# Patient Record
Sex: Male | Born: 1965 | Race: White | Hispanic: No | Marital: Married | Smoking: Current every day smoker
Health system: Southern US, Community
[De-identification: ages and names within clinical notes are randomized; demographics above are authoritative.]

## PROBLEM LIST (undated history)

## (undated) DIAGNOSIS — Z87442 Personal history of urinary calculi: Secondary | ICD-10-CM

## (undated) DIAGNOSIS — F1910 Other psychoactive substance abuse, uncomplicated: Secondary | ICD-10-CM

## (undated) DIAGNOSIS — J449 Chronic obstructive pulmonary disease, unspecified: Secondary | ICD-10-CM

## (undated) DIAGNOSIS — Z720 Tobacco use: Secondary | ICD-10-CM

## (undated) HISTORY — PX: EYE SURGERY: SHX253

## (undated) HISTORY — PX: WISDOM TOOTH EXTRACTION: SHX21

---

## 2001-04-23 ENCOUNTER — Emergency Department (HOSPITAL_COMMUNITY): Admission: EM | Admit: 2001-04-23 | Discharge: 2001-04-24 | Payer: Self-pay

## 2007-08-20 ENCOUNTER — Emergency Department (HOSPITAL_COMMUNITY): Admission: EM | Admit: 2007-08-20 | Discharge: 2007-08-20 | Payer: Self-pay | Admitting: Emergency Medicine

## 2008-06-20 ENCOUNTER — Emergency Department (HOSPITAL_COMMUNITY): Admission: EM | Admit: 2008-06-20 | Discharge: 2008-06-21 | Payer: Self-pay | Admitting: Emergency Medicine

## 2009-01-28 ENCOUNTER — Emergency Department (HOSPITAL_COMMUNITY): Admission: EM | Admit: 2009-01-28 | Discharge: 2009-01-28 | Payer: Self-pay | Admitting: Emergency Medicine

## 2009-09-08 ENCOUNTER — Emergency Department (HOSPITAL_COMMUNITY): Admission: EM | Admit: 2009-09-08 | Discharge: 2009-09-08 | Payer: Self-pay | Admitting: Emergency Medicine

## 2010-07-05 ENCOUNTER — Emergency Department (HOSPITAL_COMMUNITY): Admission: EM | Admit: 2010-07-05 | Discharge: 2010-07-05 | Payer: Self-pay | Admitting: Emergency Medicine

## 2011-01-24 LAB — DIFFERENTIAL
Basophils Absolute: 0 K/uL (ref 0.0–0.1)
Basophils Relative: 0 % (ref 0–1)
Eosinophils Absolute: 0 10*3/uL (ref 0.0–0.7)
Eosinophils Relative: 0 % (ref 0–5)
Lymphocytes Relative: 13 % (ref 12–46)
Lymphs Abs: 2.2 10*3/uL (ref 0.7–4.0)
Monocytes Absolute: 0.7 10*3/uL (ref 0.1–1.0)
Monocytes Relative: 4 % (ref 3–12)
Neutro Abs: 14.7 10*3/uL — ABNORMAL HIGH (ref 1.7–7.7)
Neutrophils Relative %: 83 % — ABNORMAL HIGH (ref 43–77)

## 2011-01-24 LAB — COMPREHENSIVE METABOLIC PANEL WITH GFR
ALT: 14 U/L (ref 0–53)
AST: 24 U/L (ref 0–37)
Albumin: 4.3 g/dL (ref 3.5–5.2)
Alkaline Phosphatase: 79 U/L (ref 39–117)
CO2: 27 meq/L (ref 19–32)
Calcium: 9.2 mg/dL (ref 8.4–10.5)
Creatinine, Ser: 1.05 mg/dL (ref 0.4–1.5)
GFR calc Af Amer: >60 mL/min (ref >60)
Glucose, Bld: 99 mg/dL (ref 70–99)
Potassium: 3.5 meq/L (ref 3.5–5.1)

## 2011-01-24 LAB — URINALYSIS, ROUTINE W REFLEX MICROSCOPIC
Bilirubin Urine: NEGATIVE
Glucose, UA: NEGATIVE mg/dL
Hgb urine dipstick: NEGATIVE
Ketones, ur: NEGATIVE mg/dL
Nitrite: NEGATIVE
Protein, ur: NEGATIVE mg/dL
Specific Gravity, Urine: 1.01 (ref 1.005–1.030)
Urobilinogen, UA: 0.2 mg/dL (ref 0.0–1.0)
pH: 7 (ref 5.0–8.0)

## 2011-01-24 LAB — COMPREHENSIVE METABOLIC PANEL
BUN: 8 mg/dL (ref 6–23)
Chloride: 103 mEq/L (ref 96–112)
GFR calc non Af Amer: >60 mL/min (ref >60)
Sodium: 139 mEq/L (ref 135–145)
Total Bilirubin: 0.7 mg/dL (ref 0.3–1.2)
Total Protein: 7.4 g/dL (ref 6.0–8.3)

## 2011-01-24 LAB — CBC
HCT: 44.8 % (ref 39.0–52.0)
Hemoglobin: 15.7 g/dL (ref 13.0–17.0)
MCHC: 34.9 g/dL (ref 30.0–36.0)
MCV: 89.1 fL (ref 78.0–100.0)
Platelets: 243 K/uL (ref 150–400)
RBC: 5.03 MIL/uL (ref 4.22–5.81)
RDW: 13.2 % (ref 11.5–15.5)
WBC: 17.7 10*3/uL — ABNORMAL HIGH (ref 4.0–10.5)

## 2011-01-24 LAB — LIPASE, BLOOD: Lipase: 12 U/L (ref 11–59)

## 2011-01-31 LAB — POCT I-STAT, CHEM 8
BUN: 12 mg/dL (ref 6–23)
Calcium, Ion: 1.18 mmol/L (ref 1.12–1.32)
Glucose, Bld: 112 mg/dL — ABNORMAL HIGH (ref 70–99)
HCT: 46 % (ref 39.0–52.0)
Sodium: 139 mEq/L (ref 135–145)

## 2011-01-31 LAB — POCT CARDIAC MARKERS: CKMB, poc: <1 ng/mL — ABNORMAL LOW (ref 1.0–8.0)

## 2011-08-01 LAB — I-STAT 8, (EC8 V) (CONVERTED LAB)
Bicarbonate: 22.1
Glucose, Bld: 130 — ABNORMAL HIGH
HCT: 45
Hemoglobin: 15.3
TCO2: 23
pCO2, Ven: 37.6 — ABNORMAL LOW

## 2011-08-01 LAB — URINE MICROSCOPIC-ADD ON

## 2011-08-01 LAB — CBC
HCT: 43.3
RBC: 4.93
RDW: 13

## 2011-08-01 LAB — DIFFERENTIAL
Eosinophils Absolute: 0.2
Eosinophils Relative: 1
Lymphocytes Relative: 20
Lymphs Abs: 3.1
Monocytes Relative: 4

## 2011-08-01 LAB — POCT I-STAT CREATININE: Operator id: 279831

## 2011-08-01 LAB — URINALYSIS, ROUTINE W REFLEX MICROSCOPIC

## 2014-12-30 ENCOUNTER — Emergency Department (HOSPITAL_COMMUNITY)
Admission: EM | Admit: 2014-12-30 | Discharge: 2014-12-30 | Disposition: A | Discharge status: Home / Self Care | Payer: Self-pay | Attending: Emergency Medicine | Admitting: Emergency Medicine

## 2014-12-30 ENCOUNTER — Encounter (HOSPITAL_COMMUNITY): Payer: Self-pay

## 2014-12-30 ENCOUNTER — Emergency Department (HOSPITAL_COMMUNITY): Payer: Self-pay

## 2014-12-30 DIAGNOSIS — F1410 Cocaine abuse, uncomplicated: Secondary | ICD-10-CM | POA: Insufficient documentation

## 2014-12-30 DIAGNOSIS — Z720 Tobacco use: Secondary | ICD-10-CM | POA: Insufficient documentation

## 2014-12-30 DIAGNOSIS — F1210 Cannabis abuse, uncomplicated: Secondary | ICD-10-CM | POA: Insufficient documentation

## 2014-12-30 DIAGNOSIS — Z880 Allergy status to penicillin: Secondary | ICD-10-CM | POA: Insufficient documentation

## 2014-12-30 DIAGNOSIS — R569 Unspecified convulsions: Secondary | ICD-10-CM | POA: Insufficient documentation

## 2014-12-30 LAB — URINE MICROSCOPIC-ADD ON

## 2014-12-30 LAB — URINALYSIS, ROUTINE W REFLEX MICROSCOPIC
Bilirubin Urine: NEGATIVE
GLUCOSE, UA: NEGATIVE mg/dL
Ketones, ur: 15 mg/dL — AB
LEUKOCYTES UA: NEGATIVE
Nitrite: NEGATIVE
Protein, ur: 30 mg/dL — AB
Specific Gravity, Urine: 1.017 (ref 1.005–1.030)
Urobilinogen, UA: 0.2 mg/dL (ref 0.0–1.0)
pH: 5 (ref 5.0–8.0)

## 2014-12-30 LAB — CBC WITH DIFFERENTIAL/PLATELET
BASOS PCT: 0 % (ref 0–1)
Basophils Absolute: 0 10*3/uL (ref 0.0–0.1)
EOS ABS: 0 10*3/uL (ref 0.0–0.7)
EOS PCT: 0 % (ref 0–5)
HCT: 41.7 % (ref 39.0–52.0)
Hemoglobin: 14.8 g/dL (ref 13.0–17.0)
LYMPHS PCT: 10 % — AB (ref 12–46)
Lymphs Abs: 1.5 10*3/uL (ref 0.7–4.0)
MCH: 29.8 pg (ref 26.0–34.0)
MCHC: 35.5 g/dL (ref 30.0–36.0)
MCV: 84.1 fL (ref 78.0–100.0)
MONOS PCT: 6 % (ref 3–12)
Monocytes Absolute: 0.9 10*3/uL (ref 0.1–1.0)
NEUTROS PCT: 84 % — AB (ref 43–77)
Neutro Abs: 11.9 10*3/uL — ABNORMAL HIGH (ref 1.7–7.7)
Platelets: 219 10*3/uL (ref 150–400)
RBC: 4.96 MIL/uL (ref 4.22–5.81)
RDW: 13 % (ref 11.5–15.5)
WBC: 14.3 10*3/uL — ABNORMAL HIGH (ref 4.0–10.5)

## 2014-12-30 LAB — BASIC METABOLIC PANEL
Anion gap: 8 (ref 5–15)
BUN: 14 mg/dL (ref 6–23)
CALCIUM: 8.8 mg/dL (ref 8.4–10.5)
CO2: 22 mmol/L (ref 19–32)
CREATININE: 1.1 mg/dL (ref 0.50–1.35)
Chloride: 106 mmol/L (ref 96–112)
GFR calc Af Amer: >90 mL/min (ref >90)
GFR calc non Af Amer: 79 mL/min — ABNORMAL LOW (ref >90)
Glucose, Bld: 97 mg/dL (ref 70–99)
Potassium: 3.9 mmol/L (ref 3.5–5.1)
SODIUM: 136 mmol/L (ref 135–145)

## 2014-12-30 LAB — RAPID URINE DRUG SCREEN, HOSP PERFORMED
Amphetamines: NOT DETECTED
BARBITURATES: NOT DETECTED
BENZODIAZEPINES: NOT DETECTED
Cocaine: POSITIVE — AB
Opiates: NOT DETECTED
Tetrahydrocannabinol: POSITIVE — AB

## 2014-12-30 LAB — ETHANOL

## 2014-12-30 MED ORDER — GADOBENATE DIMEGLUMINE 529 MG/ML IV SOLN
20.0000 mL | Freq: Once | INTRAVENOUS | Status: AC | PRN
  Administered 2014-12-30: 18 mL via INTRAVENOUS

## 2014-12-30 NOTE — ED Notes (Signed)
Patient transported to CT 

## 2014-12-30 NOTE — ED Provider Notes (Signed)
CSN: [id number]     Arrival date & time 12/30/14  1116 History   First MD Initiated Contact with Patient 12/30/14 1118     Chief Complaint  Patient presents with  . Seizures     (Consider location/radiation/quality/duration/timing/severity/associated sxs/prior Treatment) HPI Comments: Patient is a 49 year old male with no past medical history who presents via EMS after a seizure that occurred at work. 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 The shaking lasted about 5 minutes before resolving. Patient had a 15-20 minute period of confusion following the shaking. Patient has no complaints at this time. No aggravating/alleviating factors. No associated symptoms. Patient denies any history of seizures.    History reviewed. No pertinent past medical history. Past Surgical History  Procedure Laterality Date  . Wisdom tooth extraction     History reviewed. No pertinent family history. History  Substance Use Topics  . Smoking status: Current Every Day Smoker -- 2.00 packs/day  . Smokeless tobacco: Not on file  . Alcohol Use: 8.4 oz/week    14 Cans of beer per week    Review of Systems  Constitutional: Negative for fever, chills and fatigue.  HENT: Negative for trouble swallowing.   Eyes: Negative for visual disturbance.  Respiratory: Negative for shortness of breath.   Cardiovascular: Negative for chest pain and palpitations.  Gastrointestinal: Negative for nausea, vomiting, abdominal pain and diarrhea.  Genitourinary: Negative for dysuria and difficulty urinating.  Musculoskeletal: Negative for arthralgias and neck pain.  Skin: Negative for color change.  Neurological: Positive for seizures. Negative for dizziness and weakness.  Psychiatric/Behavioral: Negative for dysphoric mood.      Allergies  Penicillins  Home Medications   Prior to Admission medications   Medication Sig Start  Date End Date Taking? Authorizing Provider  Aspirin-Salicylamide-Caffeine (BC HEADACHE POWDER PO) Take 2 each by mouth as needed (pain/headache).   Yes Historical Provider, MD   BP 122/87 mmHg  Pulse 71  Temp(Src) 98.4 F (36.9 C) (Oral)  Resp 10  SpO2 97% Physical Exam  Constitutional: He is oriented to person, place, and time. He appears well-developed and well-nourished. No distress.  HENT:  Head: Normocephalic and atraumatic.  Eyes: Conjunctivae and EOM are normal. Pupils are equal, round, and reactive to light.  Neck: Normal range of motion.  Cardiovascular: Normal rate and regular rhythm.  Exam reveals no gallop and no friction rub.   No murmur heard. Pulmonary/Chest: Effort normal and breath sounds normal. He has no wheezes. He has no rales. He exhibits no tenderness.  Abdominal: Soft. He exhibits no distension. There is no tenderness. There is no rebound.  Musculoskeletal: Normal range of motion.  Neurological: He is alert and oriented to person, place, and time. No cranial nerve deficit. Coordination normal.  Extremity strength and sensation equal and intact bilaterally. Speech is goal-oriented. Moves limbs without ataxia.   Skin: Skin is warm and dry.  Psychiatric: He has a normal mood and affect. His behavior is normal.  Nursing note and vitals reviewed.   ED Course  Procedures (including critical care time) Labs Review Labs Reviewed  CBC WITH DIFFERENTIAL/PLATELET - Abnormal; Notable for the following:    WBC 14.3 (*)    Neutrophils Relative % 84 (*)    Neutro Abs 11.9 (*)    Lymphocytes Relative 10 (*)    All other components within normal limits  BASIC METABOLIC PANEL - Abnormal; Notable for the following:  GFR calc non Af Amer 79 (*)    All other components within normal limits  ETHANOL  URINE RAPID DRUG SCREEN (HOSP PERFORMED)  URINALYSIS, ROUTINE W REFLEX MICROSCOPIC    Imaging Review Ct Head Wo Contrast  12/30/2014   CLINICAL DATA:  Initial  evaluation grand mal seizure today, patient reports headache earlier this morning  EXAM: CT HEAD WITHOUT CONTRAST  TECHNIQUE: Contiguous axial images were obtained from the base of the skull through the vertex without intravenous contrast.  COMPARISON:  None.  FINDINGS: No mass lesion. No midline shift. No acute hemorrhage or hematoma. No extra-axial fluid collections. No evidence of acute infarction. Calvarium is intact. Visualized portions of paranasal sinuses and mastoid air cells are clear.  IMPRESSION: Normal head CT   Electronically Signed   By: Skipper Cliche M.D.   On: 12/30/2014 12:45     EKG Interpretation None      MDM   Final diagnoses:  Seizure    1:26 PM CT head unremarkable for acute changes. Vitals stable and patient afebrile. Labs shows elevated WBC. Patient has no complaints at this time. I will consult neurology.   1:40 PM I spoke with Dr. Armida Sans about the patient who recommends MRI with and without contrast. If normal, patient can be discharged.   Patient signed out to Dr. Harlow Mares.   Alvina Chou, PA-C 12/30/14 1613  Varney Biles, MD 01/01/15 1644

## 2014-12-30 NOTE — ED Notes (Signed)
[facility]- pt coming from work after having witnessed grand mal seizure. No history of. 18g IV in place, no meds given. Seizure lasted approximately 5 minutes according to witnesses. A&O X4 on arrival. Denies pain.

## 2014-12-30 NOTE — Discharge Instructions (Signed)
Nonepileptic Seizures Nonepileptic seizures are seizures that are not caused by abnormal electrical signals in your brain. These seizures often seem like epileptic seizures, but they are not caused by epilepsy.  There are two types of nonepileptic seizures:  A physiologic nonepileptic seizure results from a disruption in your brain.  A psychogenic seizure results from emotional stress. These seizures are sometimes called pseudoseizures. CAUSES  Causes of physiologic nonepileptic seizures include:   Sudden drop in blood pressure.  Low blood sugar.  Low levels of salt (sodium) in your blood.  Low levels of calcium in your blood.  Migraine.  Heart rhythm problems.  Sleep disorders.  Drug and alcohol abuse. Common causes of psychogenic nonepileptic seizures include:  Stress.  Emotional trauma.  Sexual or physical abuse.  Major life events, such as divorce or the death of a loved one.  Mental health disorders, including panic attack and hyperactivity disorder. SIGNS AND SYMPTOMS A nonepileptic seizure can look like an epileptic seizure, including uncontrollable shaking (convulsions), or changes in attention, behavior, or the ability to remain awake and alert. However, there are some differences. Nonepileptic seizures usually:  Do not cause physical injuries.  Start slowly.  Include crying or shrieking.  Last longer than 2 minutes.  Have a short recovery time without headache or exhaustion. DIAGNOSIS  Your health care provider can usually diagnose nonepileptic seizures after taking your medical history and giving you a physical exam. Your health care provider may want to talk to your friends or relatives who have seen you have a seizure.  You may also need to have tests to look for causes of physiologic nonepileptic seizures. This may include an electroencephalogram (EEG), which is a test that measures electrical activity in your brain. If you have had an epileptic  seizure, the results of your EEG will be abnormal. If your health care provider thinks you have had a psychogenic nonepileptic seizure, you may need to see a mental health specialist for an evaluation. TREATMENT  Treatment depends on the type and cause of your seizures.  For physiologic nonepileptic seizures, treatment is aimed at addressing the underlying condition that caused the seizures. These seizures usually stop when the underlying condition is properly treated.  Nonepileptic seizures do not respond to the seizure medicines used to treat epilepsy.  For psychogenic seizures, you may need to work with a mental health specialist. [place] care will depend on the type of nonepileptic seizures you have.   Follow all your health care provider's instructions.  Keep all your follow-up appointments. SEEK MEDICAL CARE IF: You continue to have seizures after treatment. SEEK IMMEDIATE MEDICAL CARE IF:  Your seizures change or become more frequent.  You injure yourself during a seizure.  You have one seizure after another.  You have trouble recovering from a seizure.  You have chest pain or trouble breathing. MAKE SURE YOU:  Understand these instructions.  Will watch your condition.  Will get help right away if you are not doing well or get worse. Document Released: 11/23/2005 Document Revised: 02/22/2014 Document Reviewed: 08/04/2013 [facility] Patient Information 2015 [place], [place]. This information is not intended to replace advice given to you by your health care provider. Make sure you discuss any questions you have with your health care provider.  Driving and Equipment Restrictions Some medical problems make it dangerous to drive, ride a bike, or use machines. Some of these problems are:  A hard blow to the head (concussion).  Passing out (fainting).  Twitching  and shaking (seizures).  Low blood sugar.  Taking medicine to help you relax  (sedatives).  Taking pain medicines.  Wearing an eye patch.  Wearing splints. This can make it hard to use parts of your body that you need to drive safely. HOME CARE   Do not drive until your doctor says it is okay.  Do not use machines until your doctor says it is okay. You may need a form signed by your doctor (medical release) before you can drive again. You may also need this form before you do other tasks where you need to be fully alert. MAKE SURE YOU:  Understand these instructions.  Will watch your condition.  Will get help right away if you are not doing well or get worse. Document Released: 11/15/2004 Document Revised: 12/31/2011 Document Reviewed: 02/15/2010 [facility] Patient Information 2015 [place], [place]. This information is not intended to replace advice given to you by your health care provider. Make sure you discuss any questions you have with your health care provider.

## 2014-12-30 NOTE — ED Notes (Signed)
Pt notified that urine specimen is needed

## 2015-01-04 ENCOUNTER — Ambulatory Visit (INDEPENDENT_AMBULATORY_CARE_PROVIDER_SITE_OTHER): Payer: Self-pay | Admitting: Neurology

## 2015-01-04 ENCOUNTER — Encounter: Payer: Self-pay | Admitting: Neurology

## 2015-01-04 VITALS — BP 126/84 | HR 72 | Temp 98.6°F | Resp 16 | Ht 70.0 in | Wt 164.0 lb

## 2015-01-04 DIAGNOSIS — R569 Unspecified convulsions: Secondary | ICD-10-CM

## 2015-01-04 DIAGNOSIS — F17200 Nicotine dependence, unspecified, uncomplicated: Secondary | ICD-10-CM

## 2015-01-04 DIAGNOSIS — F1490 Cocaine use, unspecified, uncomplicated: Secondary | ICD-10-CM

## 2015-01-04 DIAGNOSIS — Z720 Tobacco use: Secondary | ICD-10-CM

## 2015-01-04 DIAGNOSIS — F1410 Cocaine abuse, uncomplicated: Secondary | ICD-10-CM

## 2015-01-04 NOTE — Patient Instructions (Addendum)
Please remember to try to maintain good sleep hygiene, which means: Keep a regular sleep and wake schedule, try not to exercise or have a meal within 2 hours of your bedtime, try to keep your bedroom conducive for sleep, that is, cool and dark, without light distractors such as an illuminated alarm clock, and refrain from watching TV right before sleep or in the middle of the night and do not keep the TV or radio on during the night. Also, try not to use or play on electronic devices at bedtime, such as your cell phone, tablet PC or laptop. If you like to read at bedtime on an electronic device, try to dim the background light as much as possible. Do not eat in the middle of the night.   Please remember, common seizure triggers are: Sleep deprivation, dehydration, overheating, stress, hypoglycemia or skipping meals, certain medications or excessive alcohol use, especially stopping alcohol abruptly if you have had heavy alcohol use before (aka alcohol withdrawal seizure). If you have a prolonged seizure over 2-5 minutes or back to back seizures, call or have someone call 911 or take you to the nearest emergency room. You cannot drive a car or operate any other machinery or vehicle within 6 months of a seizure, or be at dangerous heights. Please do not swim alone or take a tub bath for safety. Do not cook with large quantities of boiling water or oil for safety. Avoid taking Wellbutrin, narcotic pain medications and tramadol, as they can lower seizure threshold.   An isolated seizure event does not warrant treatment, in particular in the setting of a possible provoked event.   We will check CBC today and do a brain wave and call you with the results.   Stop smoking, reduce caffeine intake and drink more water.   I will see you back as needed.

## 2015-01-04 NOTE — Progress Notes (Signed)
Subjective:    Patient ID: Zachary Reeves is a 49 y.o. male.  HPI     Star Age, MD, PhD [facility] [address] [place], [place] [address]  Dear [name],   I saw Mr. Zachary Reeves, as referral from the emergency room for initial visit for seizure. The patient is accompanied by his girlfriend, Threasa Beards, today. As you know, Mr. Zachary Reeves is a 49 year old right-handed gentleman with an underlying medical history of smoking (2 packs per day) and regular alcohol consumption (14 cans of beer per week reported), who presented to the emergency room on 12/30/2014 via EMS after a seizure that occurred at work. 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 He appeared to have a 15-20 minute postictal period which included confusion. At the time of evaluation in the emergency room he was back to baseline and his neurological exam was nonfocal. I reviewed the emergency room records from 12/30/2014: CT head without contrast was unremarkable. I personally reviewed the images through the PACS system. MRI brain with and without contrast on 12/30/2014 was unremarkable. I personally reviewed the images through the PACS system. I did not see any acute or chronic abnormalities. Laboratory test results showed a urinalysis with ketones positive. UDS showed positive cocaine and tetrahydrocannabinol. CBC with differential showed white count of 14.3 with 84% neutrophils. Ethanol level was negative. BMP was unremarkable. The patient is not very verbal or forthcoming with information. As I understand from him and with supplemental history provided by his girlfriend, he has never had a seizure and never had any problems since then. He has no family history of epilepsy. He has not been driving since the event. He drinks a lot of caffeine in the form of Coca-Cola  about 6-12 cans per day. He does not drink much in the way of water. He still smokes regularly. He drinks alcohol but not daily. He's never had strokelike symptoms. He snores some. He has an erratic sleep and wake schedule. He may not get enough sleep according to [facility].  His Past Medical History Is Significant For: No past medical history on file.  His Past Surgical History Is Significant For: Past Surgical History  Procedure Laterality Date  . Wisdom tooth extraction      His Family History Is Significant For: Family History  Problem Relation Age of Onset  . COPD Mother   . Cancer Father     His Social History Is Significant For: History   Social History  . Marital Status: Married    Spouse Name: N/A  . Number of Children: 1  . Years of Education: 7th grade   Social History Main Topics  . Smoking status: Current Every Day Smoker -- 2.00 packs/day  . Smokeless tobacco: Not on file  . Alcohol Use: 8.4 oz/week    14 Cans of beer per week  . Drug Use: Yes    Special: Cocaine, Marijuana  . Sexual Activity: Not on file   Other Topics Concern  . None   Social History Narrative    His Allergies Are:  Allergies  Allergen Reactions  . Penicillins Other (See Comments)    Childhood allergy  :   His Current Medications Are:  Outpatient Encounter Prescriptions as of 01/04/2015  Medication Sig  . [DISCONTINUED] Aspirin-Salicylamide-Caffeine (BC HEADACHE POWDER PO) Take 2 each by  mouth as needed (pain/headache).  :   Review of Systems:  Out of a complete 14 point review of systems, all are reviewed and negative with the exception of these symptoms as listed below:   Review of Systems  Eyes:       Blurred vision  Respiratory: Positive for cough.        Snoring  Cardiovascular: Positive for chest pain.  Neurological: Positive for seizures.       Restless legs, Snoring     Objective:  Neurologic Exam  Physical Exam Physical Examination:   Filed Vitals:    01/04/15 1405  BP: 126/84  Pulse: 72  Temp: 98.6 F (37 C)  Resp: 16   General Examination: The patient is a very pleasant 49 y.o. male in no acute distress. He appears well-developed and well-nourished and marginally groomed.   HEENT: Normocephalic, atraumatic, pupils are equal, round and reactive to light and accommodation. Funduscopic exam is normal with sharp disc margins noted. Extraocular tracking is good without limitation to gaze excursion or nystagmus noted. Normal smooth pursuit is noted. Hearing is grossly intact. Tympanic membranes are clear bilaterally. Face is symmetric with normal facial animation and normal facial sensation. Speech is clear with no dysarthria noted. There is no hypophonia. There is no lip, neck/head, jaw or voice tremor. Neck is supple with full range of passive and active motion. There are no carotid bruits on auscultation. Oropharynx exam reveals: Moderate mouth dryness, adequate dental hygiene and moderate airway crowding, due to larger uvula and redundant soft palate. Mallampati is class II. Tongue protrudes centrally and palate elevates symmetrically. Tonsils are 2+ in size.   Chest: Clear to auscultation without wheezing, rhonchi or crackles noted.  Heart: S1+S2+0, regular and normal without murmurs, rubs or gallops noted.   Abdomen: Soft, non-tender and non-distended with normal bowel sounds appreciated on auscultation.  Extremities: There is no pitting edema in the distal lower extremities bilaterally. Pedal pulses are intact.  Skin: Warm and dry without trophic changes noted. There are no varicose veins.  Musculoskeletal: exam reveals no obvious joint deformities, tenderness or joint swelling or erythema.   Neurologically:  Mental status: The patient is awake, alert and oriented in all 4 spheres. His immediate and remote memory, attention, language skills and fund of knowledge are appropriate. There is no evidence of aphasia, agnosia, apraxia or  anomia. Speech is clear with normal prosody and enunciation. Thought process is linear. Mood is normal and affect is blunted.  Cranial nerves II - XII are as described above under HEENT exam. In addition: shoulder shrug is normal with equal shoulder height noted. Motor exam: Normal bulk, strength and tone is noted. There is no drift, tremor or rebound. Romberg is negative. Reflexes are 2+ throughout. Babinski: Toes are flexor bilaterally. Fine motor skills and coordination: intact with normal finger taps, normal hand movements, normal rapid alternating patting, normal foot taps and normal foot agility.  Cerebellar testing: No dysmetria or intention tremor on finger to nose testing. Heel to shin is unremarkable bilaterally. There is no truncal or gait ataxia.  Sensory exam: intact to light touch, pinprick, vibration, temperature sense in the upper and lower extremities.  Gait, station and balance: He stands easily. No veering to one side is noted. No leaning to one side is noted. Posture is age-appropriate and stance is narrow based. Gait shows normal stride length and normal pace. No problems turning are noted. He turns en bloc. Tandem walk is unremarkable.  Assessment and Plan:  In summary, Shalamar W Cleary is a very pleasant 49 y.o.-year old male with a history of smoking and excessive caffeine use, who was found to have an isolated generalized seizure event, most likely provoked from taking cocaine. Thankfully his MRI, head CT, and physical exam today or unremarkable. I talked to him about his brain MRI and CT head scan results. I had a long chat with the patient and Threasa Beards about my findings and the diagnosis of single or isolated seizure, the prognosis and treatment options. We talked about medical treatments and non-pharmacological approaches. We talked about smoking cessation and maintaining a healthy lifestyle in general. I encouraged the patient to eat healthy, exercise daily and keep well hydrated,  to keep a scheduled bedtime and wake time routine, to not skip any meals and eat healthy snacks in between meals and to have protein with every meal.  I talked to them about seizure precautions including no driving for 6 months until he has been seizure-free for 6 months consecutively. I also talked about safety at work. He is advised not to be a danger sites and not to use dangerous equipment and a longer time elapses without any seizure-like events the better the prognosis. We will go ahead and repeat his CBC to see if his white cell count is trending down. He is advised to reduce his caffeine intake and incorporate more water for hydration. He is advised to keep regular sleep and wake time. We talked about seizure triggers as well including certain medications and I gave him written instructions. I also explained to him that a single seizure event does not warrant treatment with antiepileptic medication, in particular in the setting of her provoked situation such as the cocaine use. I counseled him on drug abuse and asked him to stop taking any illicit drugs and any prescription drugs that are not prescribed for him. His vital signs and his exam are good today and I reassured the patient in that regard. I would like to do an EEG for completion and we will call him with his CBC results as well as EEG results. In addition, he is advised to establish care with a primary care physician for general checkup and care. I can see him back on an as-needed basis. I answered all her questions today and the patient and his girlfriend were in agreement. Thank you very much for allowing me to participate in the care of this nice patient. If I can be of any further assistance to you please do not hesitate to call me at [phone].  Sincerely,   Star Age, MD, PhD

## 2015-01-05 LAB — CBC WITH DIFFERENTIAL/PLATELET
BASOS: 0 %
Basophils Absolute: 0 10*3/uL (ref 0.0–0.2)
Eos: 1 %
Eosinophils Absolute: 0.1 10*3/uL (ref 0.0–0.4)
HCT: 46.9 % (ref 37.5–51.0)
Hemoglobin: 15.7 g/dL (ref 12.6–17.7)
Immature Grans (Abs): 0 10*3/uL (ref 0.0–0.1)
Immature Granulocytes: 0 %
LYMPHS ABS: 2.7 10*3/uL (ref 0.7–3.1)
Lymphs: 26 %
MCH: 29.2 pg (ref 26.6–33.0)
MCHC: 33.5 g/dL (ref 31.5–35.7)
MCV: 87 fL (ref 79–97)
MONOS ABS: 0.7 10*3/uL (ref 0.1–0.9)
Monocytes: 6 %
Neutrophils Absolute: 6.8 10*3/uL (ref 1.4–7.0)
Neutrophils Relative %: 67 %
Platelets: 275 10*3/uL (ref 150–379)
RBC: 5.38 x10E6/uL (ref 4.14–5.80)
RDW: 13.3 % (ref 12.3–15.4)
WBC: 10.4 10*3/uL (ref 3.4–10.8)

## 2015-01-05 NOTE — Progress Notes (Signed)
Quick Note:  Please call patient: White blood cell count has come back to normal as expected. No further action is required. Star Age, MD, PhD [facility] ([facility]  ______

## 2015-05-17 ENCOUNTER — Encounter: Payer: Self-pay | Admitting: Neurology

## 2015-07-07 ENCOUNTER — Ambulatory Visit: Payer: Self-pay | Admitting: Neurology

## 2015-10-17 ENCOUNTER — Encounter (HOSPITAL_COMMUNITY): Payer: Self-pay | Admitting: Emergency Medicine

## 2015-10-17 ENCOUNTER — Emergency Department (HOSPITAL_COMMUNITY)
Admission: EM | Admit: 2015-10-17 | Discharge: 2015-10-17 | Disposition: A | Discharge status: Home / Self Care | Payer: Self-pay | Attending: Emergency Medicine | Admitting: Emergency Medicine

## 2015-10-17 DIAGNOSIS — Y9389 Activity, other specified: Secondary | ICD-10-CM | POA: Insufficient documentation

## 2015-10-17 DIAGNOSIS — F17200 Nicotine dependence, unspecified, uncomplicated: Secondary | ICD-10-CM | POA: Insufficient documentation

## 2015-10-17 DIAGNOSIS — Y9289 Other specified places as the place of occurrence of the external cause: Secondary | ICD-10-CM | POA: Insufficient documentation

## 2015-10-17 DIAGNOSIS — S0502XA Injury of conjunctiva and corneal abrasion without foreign body, left eye, initial encounter: Secondary | ICD-10-CM | POA: Insufficient documentation

## 2015-10-17 DIAGNOSIS — Y998 Other external cause status: Secondary | ICD-10-CM | POA: Insufficient documentation

## 2015-10-17 DIAGNOSIS — X58XXXA Exposure to other specified factors, initial encounter: Secondary | ICD-10-CM | POA: Insufficient documentation

## 2015-10-17 DIAGNOSIS — Z880 Allergy status to penicillin: Secondary | ICD-10-CM | POA: Insufficient documentation

## 2015-10-17 MED ORDER — FLUORESCEIN SODIUM 1 MG OP STRP
1.0000 | ORAL_STRIP | Freq: Once | OPHTHALMIC | Status: AC
  Administered 2015-10-17: 1 via OPHTHALMIC
  Filled 2015-10-17: qty 1

## 2015-10-17 MED ORDER — OXYCODONE-ACETAMINOPHEN 5-325 MG PO TABS: 2.0000 | ORAL_TABLET | Freq: Four times a day (QID) | ORAL | Status: DC | PRN

## 2015-10-17 MED ORDER — TETRACAINE HCL 0.5 % OP SOLN
1.0000 [drp] | Freq: Once | OPHTHALMIC | Status: AC
  Administered 2015-10-17: 1 [drp] via OPHTHALMIC
  Filled 2015-10-17: qty 2

## 2015-10-17 MED ORDER — POLYMYXIN B-TRIMETHOPRIM 10000-0.1 UNIT/ML-% OP SOLN: 1.0000 [drp] | OPHTHALMIC | Status: DC

## 2015-10-17 MED ORDER — POLYMYXIN B-TRIMETHOPRIM 10000-0.1 UNIT/ML-% OP SOLN
1.0000 [drp] | OPHTHALMIC | Status: DC
  Filled 2015-10-17: qty 10

## 2015-10-17 MED ORDER — NEOMYCIN-POLYMYXIN-DEXAMETH 3.5-10000-0.1 OP SUSP
1.0000 [drp] | OPHTHALMIC | Status: DC
  Administered 2015-10-17: 1 [drp] via OPHTHALMIC
  Filled 2015-10-17: qty 5

## 2015-10-17 NOTE — ED Notes (Signed)
Cutting stone today when some brick chips flew into his L eye.

## 2015-10-17 NOTE — ED Notes (Signed)
Pt departed in NAD.  

## 2015-10-17 NOTE — Discharge Instructions (Signed)

## 2015-10-17 NOTE — ED Provider Notes (Signed)
CSN: [id number]     Arrival date & time 10/17/15  2157 History  By signing my name below, I, Erling Conte, attest that this documentation has been prepared under the direction and in the presence of Montine Circle, PA-C Electronically Signed: Erling Conte, ED Scribe. 10/17/2015. 10:23 PM.     No chief complaint on file.  The history is provided by the patient. No language interpreter was used.    HPI Comments: Zachary Reeves is a 49 y.o. male who presents to the Emergency Department with a chief complaint of a possible foreign body in his left eye onset today. Pt states he was sawing some stone chips and some of the debris got into his eye. He reports associated left eye pain, HA and photophobia. He denies any alleviating/aggravating factors. Pt has not tried any treatments or taken any medications prior to arrival. Pt is unsure of his tetanus vaccination status. He denies any other associated symptoms at this time.  No past medical history on file. Past Surgical History  Procedure Laterality Date  . Wisdom tooth extraction     Family History  Problem Relation Age of Onset  . COPD Mother   . Cancer Father    Social History  Substance Use Topics  . Smoking status: Current Every Day Smoker -- 2.00 packs/day  . Smokeless tobacco: Not on file  . Alcohol Use: 8.4 oz/week    14 Cans of beer per week    Review of Systems  Eyes: Positive for photophobia and pain.  Neurological: Positive for headaches.      Allergies  Penicillins  Home Medications   Prior to Admission medications   Not on File   Triage Vitals: BP 132/82 mmHg  Pulse 70  Temp(Src) 97.4 F (36.3 C) (Oral)  Resp 16  Ht 5\' 11"  (1.803 m)  Wt 175 lb (79.379 kg)  BMI 24.42 kg/m2  SpO2 98%  Physical Exam  Physical Exam  Constitutional: Pt is oriented to person, place, and time. Pt appears well-developed and well-nourished. No distress.  HENT:  Head: Normocephalic and atraumatic.  Nose: Nose normal. No  mucosal edema or rhinorrhea.  Mouth/Throat: Uvula is midline, oropharynx is clear and moist and mucous membranes are normal. No uvula swelling. No oropharyngeal exudate, posterior oropharyngeal edema, posterior oropharyngeal erythema or tonsillar abscesses.  Eyes: Conjunctivae, EOM and lids are normal. Pupils are equal, round, and reactive to light. Lids are everted and swept, no foreign bodies found.    Pupils equal round and reactive to light No vertical, horizontal or rotational nystagmus Moderate corneal abrasion noted to the left eye at the 3 o'clock position with fluorescein uptake No visible foreign body No corneal flare, ulcer or dendritic staining  No herpetic lesions to the face or around the eye  Visual Acuity:   Visual Acuity  Right Eye Distance: 20/15 Left Eye Distance: Pt unable to open L eye Bilateral Distance: Pt unable to open L eye  Right Eye Near:   Left Eye Near:    Bilateral Near:     Neck: Normal range of motion.  Full range of motion without pain No midline or paraspinal tenderness No nuchal rigidity; no meningeal signs  Cardiovascular: Normal rate, regular rhythm and intact distal pulses.   Pulmonary/Chest: Effort normal. No respiratory distress.  Musculoskeletal: Normal range of motion.  Neurological: Pt is alert and oriented to person, place, and time.  Cranial Nerves:  II:  Peripheral visual fields grossly normal, pupils equal, round, reactive to light  III,IV, VI: ptosis not present, extra-ocular motions intact bilaterally  Skin: Skin is warm and dry. Pt is not diaphoretic. No erythema.  Psychiatric: Pt has a normal mood and affect.  Nursing note and vitals reviewed.    ED Course  Procedures (including critical care time)  DIAGNOSTIC STUDIES: Oxygen Saturation is 98% on RA, normal by my interpretation.    COORDINATION OF CARE:      MDM   Final diagnoses:  Corneal abrasion, left, initial encounter    Patient with corneal abrasion to  left eye.  No foreign body.  Will treat with polytrim and pain medicine.  DC to home with ophthalmology follow-up in 2 days.  Patient understands and agrees with the plan.  I personally performed the services described in this documentation, which was scribed in my presence. The recorded information has been reviewed and is accurate.      Montine Circle, PA-C 10/17/15 2316  Varney Biles, MD 10/18/15 [id number]

## 2018-10-27 ENCOUNTER — Emergency Department (HOSPITAL_COMMUNITY)
Admission: EM | Admit: 2018-10-27 | Discharge: 2018-10-27 | Disposition: A | Discharge status: Home / Self Care | Payer: Self-pay | Attending: Emergency Medicine | Admitting: Emergency Medicine

## 2018-10-27 ENCOUNTER — Emergency Department (HOSPITAL_COMMUNITY): Payer: Self-pay

## 2018-10-27 ENCOUNTER — Encounter (HOSPITAL_COMMUNITY): Payer: Self-pay | Admitting: Emergency Medicine

## 2018-10-27 DIAGNOSIS — R0789 Other chest pain: Secondary | ICD-10-CM | POA: Insufficient documentation

## 2018-10-27 DIAGNOSIS — F17200 Nicotine dependence, unspecified, uncomplicated: Secondary | ICD-10-CM | POA: Insufficient documentation

## 2018-10-27 HISTORY — DX: Tobacco use: Z72.0

## 2018-10-27 HISTORY — DX: Other psychoactive substance abuse, uncomplicated: F19.10

## 2018-10-27 LAB — I-STAT CHEM 8, ED
BUN: 24 mg/dL — ABNORMAL HIGH (ref 6–20)
CREATININE: 0.9 mg/dL (ref 0.61–1.24)
Calcium, Ion: 1 mmol/L — ABNORMAL LOW (ref 1.15–1.40)
Chloride: 104 mmol/L (ref 98–111)
Glucose, Bld: 112 mg/dL — ABNORMAL HIGH (ref 70–99)
HEMATOCRIT: 49 % (ref 39.0–52.0)
Hemoglobin: 16.7 g/dL (ref 13.0–17.0)
POTASSIUM: 4.1 mmol/L (ref 3.5–5.1)
SODIUM: 135 mmol/L (ref 135–145)
TCO2: 26 mmol/L (ref 22–32)

## 2018-10-27 LAB — BASIC METABOLIC PANEL
ANION GAP: 12 (ref 5–15)
BUN: 19 mg/dL (ref 6–20)
CO2: 23 mmol/L (ref 22–32)
Calcium: 9.2 mg/dL (ref 8.9–10.3)
Chloride: 100 mmol/L (ref 98–111)
Creatinine, Ser: 1.23 mg/dL (ref 0.61–1.24)
Glucose, Bld: 138 mg/dL — ABNORMAL HIGH (ref 70–99)
POTASSIUM: 4.1 mmol/L (ref 3.5–5.1)
Sodium: 135 mmol/L (ref 135–145)

## 2018-10-27 LAB — CBC
HCT: 52.8 % — ABNORMAL HIGH (ref 39.0–52.0)
HEMOGLOBIN: 17.7 g/dL — AB (ref 13.0–17.0)
MCH: 29 pg (ref 26.0–34.0)
MCHC: 33.5 g/dL (ref 30.0–36.0)
MCV: 86.6 fL (ref 80.0–100.0)
Platelets: 287 10*3/uL (ref 150–400)
RBC: 6.1 MIL/uL — AB (ref 4.22–5.81)
RDW: 12.5 % (ref 11.5–15.5)
WBC: 10.3 10*3/uL (ref 4.0–10.5)
nRBC: 0 % (ref 0.0–0.2)

## 2018-10-27 LAB — I-STAT TROPONIN, ED: TROPONIN I, POC: 0 ng/mL (ref 0.00–0.08)

## 2018-10-27 LAB — D-DIMER, QUANTITATIVE: D-Dimer, Quant: <0.27 ug/mL-FEU (ref 0.00–0.50)

## 2018-10-27 NOTE — ED Triage Notes (Signed)
Poor historian. Pt reports CP onset this AM, L sided, non radiating, 4/10, pressure. Then states it started last night, then wife said it has been going on X few days. Also reports SOB, pt smokes 2 packs/day. Pt denies being sick yet appears congested, running nose, etc.

## 2018-10-27 NOTE — ED Notes (Signed)
ED Provider at bedside. 

## 2018-10-27 NOTE — ED Notes (Signed)
Lt mid-chest pain since yesterday with sl sob.  No shortness of breath at present  He had advil that did not help his pain  No known injury

## 2018-10-27 NOTE — ED Notes (Signed)
Pt has left, did not want to wait for results/discharge.

## 2018-10-27 NOTE — ED Provider Notes (Signed)
[facility] EMERGENCY DEPARTMENT Provider Note   CSN: [id number] Arrival date & time: 10/27/18  2542     History   Chief Complaint Chief Complaint  Patient presents with  . Chest Pain    HPI Zachary Reeves is a 53 y.o. male.  The history is provided by the patient and the spouse.     Zachary Reeves is a 53 y.o. male, with a history of tobacco and drug abuse, presenting to the ED with chest pain.  Pain is left lateral chest, feels like a tightness, 5/10, nonradiating.  He has been having similar pain intermittently for the past couple weeks, but last night the pain became more constant. Patient has had a few days of nasal congestion. Declined pain management or other symptom management. Denies fever/chills, shortness of breath, back pain, abdominal pain, urinary symptoms, N/V/D, increased cough, dizziness, diaphoresis, syncope, trauma, or any other complaints.   Past Medical History:  Diagnosis Date  . Drug abuse ([place])   . Tobacco abuse     There are no active problems to display for this patient.   Past Surgical History:  Procedure Laterality Date  . WISDOM TOOTH EXTRACTION          Home Medications    Prior to Admission medications   Medication Sig Start Date End Date Taking? Authorizing Provider  oxyCODONE-acetaminophen (PERCOCET/ROXICET) 5-325 MG tablet Take 2 tablets by mouth every 6 (six) hours as needed for severe pain. Patient not taking: Reported on 10/27/2018 10/17/15   Montine Circle, PA-C  trimethoprim-polymyxin b (POLYTRIM) ophthalmic solution Place 1 drop into the left eye every 4 (four) hours. Patient not taking: Reported on 10/27/2018 10/17/15   Montine Circle, PA-C    Family History Family History  Problem Relation Age of Onset  . COPD Mother   . Cancer Father     Social History Social History   Tobacco Use  . Smoking status: Current Every Day Smoker    Packs/day: 2.00  . Smokeless tobacco: Never Used  Substance Use Topics   . Alcohol use: Yes    Alcohol/week: 14.0 standard drinks    Types: 14 Cans of beer per week  . Drug use: Yes    Types: Cocaine, Marijuana     Allergies   Penicillins   Review of Systems Review of Systems  Constitutional: Negative for chills, diaphoresis and fever.  Respiratory: Negative for cough and shortness of breath.   Cardiovascular: Positive for chest pain. Negative for palpitations and leg swelling.  Gastrointestinal: Negative for abdominal pain, diarrhea, nausea and vomiting.  Musculoskeletal: Negative for back pain.  Neurological: Negative for dizziness, syncope, weakness and numbness.  All other systems reviewed and are negative.    Physical Exam Updated Vital Signs BP (!) 131/108 (BP Location: Right Arm)   Pulse (!) 103   Temp 98.1 F (36.7 C) (Oral)   Resp 18   Ht 5\' 5"  (1.651 m)   Wt 77.1 kg   SpO2 100%   BMI 28.29 kg/m   Physical Exam Vitals signs and nursing note reviewed.  Constitutional:      General: He is not in acute distress.    Appearance: He is well-developed. He is not diaphoretic.  HENT:     Head: Normocephalic and atraumatic.     Mouth/Throat:     Mouth: Mucous membranes are moist.     Pharynx: Oropharynx is clear.  Eyes:     Conjunctiva/sclera: Conjunctivae normal.  Neck:  Musculoskeletal: Neck supple.  Cardiovascular:     Rate and Rhythm: Normal rate and regular rhythm.     Pulses: Normal pulses.          Radial pulses are 2+ on the right side and 2+ on the left side.       Posterior tibial pulses are 2+ on the right side and 2+ on the left side.     Heart sounds: Normal heart sounds.  Pulmonary:     Effort: Pulmonary effort is normal. No respiratory distress.     Breath sounds: Normal breath sounds.  Chest:     Chest wall: No deformity, swelling or tenderness.  Abdominal:     Palpations: Abdomen is soft.     Tenderness: There is no abdominal tenderness. There is no guarding.  Musculoskeletal:     Right lower leg: No  edema.     Left lower leg: No edema.     Comments: Full ROM without pain in left shoulder. No increase or change in chest pain with ROM of the shoulder.  Lymphadenopathy:     Cervical: No cervical adenopathy.  Skin:    General: Skin is warm and dry.     Capillary Refill: Capillary refill takes less than 2 seconds.  Neurological:     Mental Status: He is alert.  Psychiatric:        Behavior: Behavior normal.      ED Treatments / Results  Labs (all labs ordered are listed, but only abnormal results are displayed) Labs Reviewed  BASIC METABOLIC PANEL - Abnormal; Notable for the following components:      Result Value   Glucose, Bld 138 (*)    All other components within normal limits  CBC - Abnormal; Notable for the following components:   RBC 6.10 (*)    Hemoglobin 17.7 (*)    HCT 52.8 (*)    All other components within normal limits  I-STAT CHEM 8, ED - Abnormal; Notable for the following components:   BUN 24 (*)    Glucose, Bld 112 (*)    Calcium, Ion 1.00 (*)    All other components within normal limits  D-DIMER, QUANTITATIVE (NOT AT [facility])  URINALYSIS, ROUTINE W REFLEX MICROSCOPIC  RAPID URINE DRUG SCREEN, HOSP PERFORMED  I-STAT TROPONIN, ED    EKG EKG Interpretation  Date/Time:  Monday October 27 2018 06:19:15 EST Ventricular Rate:  105 PR Interval:  130 QRS Duration: 78 QT Interval:  320 QTC Calculation: 422 R Axis:   73 Text Interpretation:  Sinus tachycardia Right atrial enlargement Borderline ECG No significant change since 12/30/2014 Confirmed by Veryl Speak [phone]) on 10/27/2018 6:45:23 AM   Radiology Dg Chest 2 View  Result Date: 10/27/2018 CLINICAL DATA:  Initial evaluation for acute left-sided chest pain, cough. EXAM: CHEST - 2 VIEW COMPARISON:  Prior radiograph from 01/28/2009. FINDINGS: The cardiac and mediastinal silhouettes are stable in size and contour, and remain within normal limits. The lungs are mildly hyperinflated with suspected mild  emphysematous changes. No focal infiltrates. No pulmonary edema or pleural effusion. No pneumothorax. No acute osseous abnormality identified. IMPRESSION: 1. No active cardiopulmonary disease. 2. Suspected emphysema. Electronically Signed   By: Jeannine Boga M.D.   On: 10/27/2018 07:04    Procedures Procedures (including critical care time)  Medications Ordered in ED Medications - No data to display   Initial Impression / Assessment and Plan / ED Course  I have reviewed the triage vital signs and the nursing notes.  Pertinent labs & imaging results that were available during my care of the patient were reviewed by me and considered in my medical decision making (see chart for details).      Patient presents with chest pain that has been intermittent for the last couple weeks. Low suspicion for ACS. No high risk/suspicious features; no exertional chest pain, vomiting, diaphoresis, or radiation. HEART score is 2, indicating low risk for a cardiac event. Wells criteria score is 1.5, indicating low risk for PE.  Troponin negative.  Patient walked out of the department without giving me the opportunity to speak with him prior to him leaving. D-dimer resulted after patient left, but was negative.  Patient did not wait for any follow-up or discharge instructions.     Vitals:   10/27/18 0700 10/27/18 0730 10/27/18 0800 10/27/18 0830  BP: (!) 115/94 106/90 (!) 108/93 118/90  Pulse: 85 90 81 91  Resp: 16 12 20  (!) 26  Temp:      TempSrc:      SpO2: 100% 99% 98% 99%  Weight:      Height:         Final Clinical Impressions(s) / ED Diagnoses   Final diagnoses:  Atypical chest pain    ED Discharge Orders    None       Layla Maw 10/27/18 1047    Veryl Speak, MD 10/28/18 [phone]

## 2019-02-20 ENCOUNTER — Other Ambulatory Visit: Payer: Self-pay | Admitting: Otolaryngology

## 2019-02-20 DIAGNOSIS — R221 Localized swelling, mass and lump, neck: Secondary | ICD-10-CM

## 2019-02-26 NOTE — H&P (Signed)
HPI:   Zachary Reeves is a 53 y.o. male who presents as a consult Patient.   Referring Provider: Kaleen Mask, PA-C  Chief complaint: Neck mass.  HPI: 5 weeks ago he noticed a lump in the right upper neck area. He denies any symptoms related to that, such as pain or fluctuation, he also denies any head neck symptoms such as sore throat, ear pain, trouble swallowing, voice change, and he denies any constitutional symptoms such as loss of appetite, fever or weight loss. He smokes 2 pack/day. He drinks somewhere between 6-12 beer on the weekends.  PMH/Meds/All/SocHx/FamHx/ROS:   Past Medical History:  Diagnosis Date  . Allergy   History reviewed. No pertinent surgical history.  No family history of bleeding disorders, wound healing problems or difficulty with anesthesia.   Social History   Socioeconomic History  . Marital status: Married  Spouse name: Not on file  . Number of children: Not on file  . Years of education: Not on file  . Highest education level: Not on file  Occupational History  . Not on file  Social Needs  . Financial resource strain: Not on file  . Food insecurity  Worry: Not on file  Inability: Not on file  . Transportation needs  Medical: Not on file  Non-medical: Not on file  Tobacco Use  . Smoking status: Current Every Day Smoker  . Smokeless tobacco: Never Used  Substance and Sexual Activity  . Alcohol use: Not on file  . Drug use: Not on file  . Sexual activity: Not on file  Lifestyle  . Physical activity  Days per week: Not on file  Minutes per session: Not on file  . Stress: Not on file  Relationships  . Social Medical illustrator on phone: Not on file  Gets together: Not on file  Attends religious service: Not on file  Active member of club or organization: Not on file  Attends meetings of clubs or organizations: Not on file  Relationship status: Not on file  Other Topics Concern  . Not on file  Social History Narrative  . Not on  file   Current Outpatient Medications:  . azelastine (ASTELIN) 137 mcg (0.1 %) nasal spray, azelastine 137 mcg (0.1 %) nasal spray aerosol 2 SPRAYS PER NOSTRIL TWICE A DAY. CONTINUE OTC FLONASE TOO., Disp: , Rfl:  . pseudoephedrine-guaifenesin (MUCINEX D) 60-600 mg per tablet, Mucinex D 60 mg-600 mg tablet,extended release AS DIRECTED, Disp: , Rfl:   A complete ROS was performed with pertinent positives/negatives noted in the HPI. The remainder of the ROS are negative.   Physical Exam:   Ht 1.727 m (5\' 8" )  Wt 68 kg (150 lb)  BMI 22.81 kg/m   General: Healthy and alert, in no distress, breathing easily. Normal affect. In a pleasant mood. Head: Normocephalic, atraumatic. No masses, or scars. Eyes: Pupils are equal, and reactive to light. Vision is grossly intact. No spontaneous or gaze nystagmus. Ears: Ear canals are clear. Tympanic membranes are intact, with normal landmarks and the middle ears are clear and healthy. Hearing: Grossly normal. Nose: Nasal cavities are clear with healthy mucosa, no polyps or exudate. Airways are patent. Face: No masses or scars, facial nerve function is symmetric. Oral Cavity: No mucosal abnormalities are noted. Tongue with normal mobility. Dentition appears intact with diffuse dental disease. Oropharynx: Tonsils are moderately asymmetric, right side is a little bit larger. By palpation, the right tonsil is a little bit firm as well. There are no  mucosal masses identified. Tongue base appears normal and healthy. Larynx/Hypopharynx: Mirror exam is limited but no lesions were identified in the supraglottic larynx or the hypopharynx. Chest: Deferred Neck: 3 cm right level 2 mass, mobile, not fixed, no other associated masses, no thyroid nodules or enlargement. Neuro: Cranial nerves II-XII with normal function. Balance: Normal gate. Other findings: none.  Independent Review of Additional Tests or Records:  none  Procedures:  Procedure  Note:  Indications for procedure: neck mass  Details of the procedure were discussed with the patient and all questions were answered.  Procedure:  2% xylocaine with epinephrine was infiltrated into the overlying skin. First pass was made with a 25 gauge needle and 10 cc syringe. Second pass was made with a 22 gauge needle. Specimen was placed on microscopic slides and air-dried. Additional material was placed in Cytolyte solution for cell block preparation. A third pass was made with a 22 guage needle and sample was added to the cytolyte solution.  A bandage was applied.   He tolerated the procedure well. Results will be discussed when available.  Impression & Plans:  Asymptomatic neck mass. 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 FNA was performed today. We will set him up for CT of the neck. Strongly urged him to consider stopping smoking and drinking. We discussed the ongoing risks of additional cancers and poor prognosis if he continues smoking. We discussed the possible role of radiation with or without chemotherapy versus robotic surgery. We will make the appropriate referrals after the biopsy returns.

## 2019-02-27 NOTE — Pre-Procedure Instructions (Signed)
KIOWA HOLLAR  02/27/2019      [id number] - [place], [place] [address] [place] [address] Phone: [phone] Fax: [phone]    Your procedure is scheduled on Wednesday 05-13-120 from [id number]  Report to Emma at [email or web address].  Call this number if you have problems the morning of surgery:  [phone]   Remember:  Do not eat or drink after midnight.     Take these medicines the morning of surgery with A SIP OF WATER  -As needed :Mucinex  As needed Astelin nasal spray.              As of today STOP taking any Aspirin (unless otherwise instructed by your surgeon),               Aleve, Naproxen, Ibuprofen, Motrin, Advil, Goody's, BC's, all herbal medications, fish             oil, and all vitamins.   Do not wear jewelry, make-up or nail polish.  Do not wear lotions, powders, or colognes, or deodorant.  Do not shave 48 hours prior to surgery.  Men may shave face and neck.  Do not bring valuables to the hospital.  [facility] is not responsible for any belongings or valuables.  Contacts, dentures or bridgework may not be worn into surgery.  Leave your suitcase in the car.  After surgery it may be brought to your room.  For patients admitted to the hospital, discharge time will be determined by your treatment team.  Patients discharged the day of surgery will not be allowed to drive home.   Special instructions:  [facility]- Preparing For Surgery  Before surgery, you can play an important role. Because skin is not sterile, your skin needs to be as free of germs as possible. You can reduce the number of germs on your skin by washing with CHG (chlorahexidine gluconate) Soap before surgery.  CHG is an antiseptic cleaner which kills germs and bonds with the skin to continue killing germs even after washing.    Oral Hygiene is also important to reduce your risk of infection.   Remember - BRUSH YOUR TEETH THE MORNING OF SURGERY WITH YOUR REGULAR TOOTHPASTE  Please do not use if you have an allergy to CHG or antibacterial soaps. If your skin becomes reddened/irritated stop using the CHG.  Do not shave (including legs and underarms) for at least 48 hours prior to first CHG shower. It is OK to shave your face.  Please follow these instructions carefully.   1. Shower the NIGHT BEFORE SURGERY and the MORNING OF SURGERY with CHG.   2. If you chose to wash your hair, wash your hair first as usual with your normal shampoo.  3. After you shampoo, rinse your hair and body thoroughly to remove the shampoo.  4. Use CHG as you would any other liquid soap. You can apply CHG directly to the skin and wash gently with a scrungie or a clean washcloth.   5. Apply the CHG Soap to your body ONLY FROM THE NECK DOWN.  Do not use on open wounds or open sores. Avoid contact with your eyes, ears, mouth and genitals (private parts). Wash Face and genitals (private parts)  with your normal soap.  6. Wash thoroughly, paying special attention to the area where your surgery will be performed.  7. Thoroughly rinse your body  with warm water from the neck down.  8. DO NOT shower/wash with your normal soap after using and rinsing off the CHG Soap.  9. Pat yourself dry with a CLEAN TOWEL.  10. Wear CLEAN PAJAMAS to bed the night before surgery, wear comfortable clothes the morning of surgery  11. Place CLEAN SHEETS on your bed the night of your first shower and DO NOT SLEEP WITH PETS.  Day of Surgery:  Do not apply any deodorants/lotions.  Please wear clean clothes to the hospital/surgery center.   Remember to brush your teeth WITH YOUR REGULAR TOOTHPASTE.  Please read over the following fact sheets that you were given. Pain Booklet, Coughing and Deep Breathing and Surgical Site Infection Prevention

## 2019-03-02 ENCOUNTER — Other Ambulatory Visit: Payer: Self-pay

## 2019-03-02 ENCOUNTER — Encounter (HOSPITAL_COMMUNITY)
Admission: RE | Admit: 2019-03-02 | Discharge: 2019-03-02 | Disposition: A | Discharge status: Home / Self Care | Payer: Self-pay | Source: Ambulatory Visit | Attending: Otolaryngology | Admitting: Otolaryngology

## 2019-03-02 ENCOUNTER — Encounter (HOSPITAL_COMMUNITY): Payer: Self-pay

## 2019-03-02 ENCOUNTER — Other Ambulatory Visit (HOSPITAL_COMMUNITY)
Admission: RE | Admit: 2019-03-02 | Discharge: 2019-03-02 | Disposition: A | Discharge status: Home / Self Care | Payer: HRSA Program | Source: Ambulatory Visit | Attending: Otolaryngology | Admitting: Otolaryngology

## 2019-03-02 DIAGNOSIS — Z01812 Encounter for preprocedural laboratory examination: Secondary | ICD-10-CM | POA: Insufficient documentation

## 2019-03-02 DIAGNOSIS — Z1159 Encounter for screening for other viral diseases: Secondary | ICD-10-CM | POA: Diagnosis present

## 2019-03-02 DIAGNOSIS — F17210 Nicotine dependence, cigarettes, uncomplicated: Secondary | ICD-10-CM | POA: Insufficient documentation

## 2019-03-02 DIAGNOSIS — Z7289 Other problems related to lifestyle: Secondary | ICD-10-CM | POA: Insufficient documentation

## 2019-03-02 HISTORY — DX: Personal history of urinary calculi: Z87.442

## 2019-03-02 LAB — COMPREHENSIVE METABOLIC PANEL
ALT: 20 U/L (ref 0–44)
AST: 23 U/L (ref 15–41)
Albumin: 3.6 g/dL (ref 3.5–5.0)
Alkaline Phosphatase: 91 U/L (ref 38–126)
Anion gap: 8 (ref 5–15)
BUN: 15 mg/dL (ref 6–20)
CO2: 22 mmol/L (ref 22–32)
Calcium: 8.8 mg/dL — ABNORMAL LOW (ref 8.9–10.3)
Chloride: 108 mmol/L (ref 98–111)
Creatinine, Ser: 0.89 mg/dL (ref 0.61–1.24)
GFR calc Af Amer: >60 mL/min (ref >60)
GFR calc non Af Amer: >60 mL/min (ref >60)
Glucose, Bld: 99 mg/dL (ref 70–99)
Potassium: 4.3 mmol/L (ref 3.5–5.1)
Sodium: 138 mmol/L (ref 135–145)
Total Bilirubin: 0.6 mg/dL (ref 0.3–1.2)
Total Protein: 6.9 g/dL (ref 6.5–8.1)

## 2019-03-02 LAB — CBC
HCT: 45.4 % (ref 39.0–52.0)
Hemoglobin: 15.1 g/dL (ref 13.0–17.0)
MCH: 29 pg (ref 26.0–34.0)
MCHC: 33.3 g/dL (ref 30.0–36.0)
MCV: 87.3 fL (ref 80.0–100.0)
Platelets: 211 10*3/uL (ref 150–400)
RBC: 5.2 MIL/uL (ref 4.22–5.81)
RDW: 12.7 % (ref 11.5–15.5)
WBC: 7.8 10*3/uL (ref 4.0–10.5)
nRBC: 0 % (ref 0.0–0.2)

## 2019-03-02 NOTE — Progress Notes (Signed)
PCP - denies Cardiologist - denies  Chest x-ray - N/A EKG - 10-27-18  Anesthesia review: yes, ECG from 10-27-18  Patient denies shortness of breath, fever, cough and chest pain at PAT appointment   Patient verbalized understanding of instructions that were given to them at the PAT appointment. Patient was also instructed that they will need to review over the PAT instructions again at home before surgery.

## 2019-03-02 NOTE — Anesthesia Preprocedure Evaluation (Addendum)
Anesthesia Evaluation  Patient identified by MRN, date of birth, ID band Patient awake    Reviewed: Allergy & Precautions, NPO status , Patient's Chart, lab work & pertinent test results  History of Anesthesia Complications Negative for: history of anesthetic complications  Airway Mallampati: II  TM Distance: >3 FB Neck ROM: Full    Dental  (+) Edentulous Upper, Poor Dentition, Missing, Dental Advisory Given, Chipped   Pulmonary Current Smoker,  03/02/2019 SARS Coronavirus NEG   breath sounds clear to auscultation       Cardiovascular negative cardio ROS   Rhythm:Regular Rate:Normal     Neuro/Psych negative neurological ROS     GI/Hepatic negative GI ROS, (+)     substance abuse (no cocaine within years)  alcohol use, cocaine use and marijuana use,   Endo/Other  negative endocrine ROS  Renal/GU negative Renal ROS     Musculoskeletal   Abdominal   Peds  Hematology negative hematology ROS (+)   Anesthesia Other Findings Tonsillar mass  Reproductive/Obstetrics                           Anesthesia Physical Anesthesia Plan  ASA: III  Anesthesia Plan: General   Post-op Pain Management:    Induction: Intravenous  PONV Risk Score and Plan: 1 and Ondansetron and Dexamethasone  Airway Management Planned: Oral ETT  Additional Equipment:   Intra-op Plan:   Post-operative Plan: Extubation in OR  Informed Consent: I have reviewed the patients History and Physical, chart, labs and discussed the procedure including the risks, benefits and alternatives for the proposed anesthesia with the patient or authorized representative who has indicated his/her understanding and acceptance.     Dental advisory given  Plan Discussed with: CRNA and Surgeon  Anesthesia Plan Comments: (See PAT note written 03/02/2019 by Myra Gianotti, PA-C. )      Anesthesia Quick Evaluation

## 2019-03-02 NOTE — Progress Notes (Signed)
Anesthesia Chart Review:  Case:  [id number] Date/Time:  03/04/19 0815   Procedure:  DIRECT LARYNGOSCOPY, biopsy of tonsil with frozen section, possible tonsillectomy, possible core biopsy of neck (Right )   Anesthesia type:  General   Pre-op diagnosis:  Neck mass   Location:  [facility] OR ROOM 08 / [facility]   Surgeon:  Izora Gala, MD      DISCUSSION: Patient is a 53 year old male scheduled for the above procedure.  History includes polysubstance abuse (cocaine, THC; tobacco smoking, 14 cans beer/week), single seizure in 2016 (neurology felt likely related to cocaine use). Reports no cocaine use in several months.   - Urgent Care visit 02/11/19 for right neck mass x 1 month. Referred to ENT and seen by Dr. Constance Holster on 02/19/19. 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 FNA performed, but non-diagnostic (atypical cells, necrotic cells) and CT of the neck ordered (not done yet; wife reports CT to be done sometime after surgery depending on pathology results). Smoking and drinking cessation encouraged and the above surgery recommended.      - ED visit for chest pain 10/27/18 that had been occurring intermittently over the past few weeks. No SOB, dizziness, syncope, fever. Per ED provider note, "Low suspicion for ACS. No high risk/suspicious features; no exertional chest pain, vomiting, diaphoresis, or radiation. HEART score is 2, indicating low risk for a cardiac event. Wells criteria score is 1.5, indicating low risk for PE. Troponin negative.  Patient walked out of the department without giving me the opportunity to speak with him prior to him leaving. D-dimer resulted after patient left, but was negative." Anesthesia APP asked to review after patient left PAT visit. I did call and speak with patient. He reported chest pain in January thought to be musculoskeletal in nature. 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 He does tend to have a "stuffy nose" during allergy season. He denied dizziness, syncope, seizure (since 2016), edema. He has not used cocaine in several months and was advised to continue to refrain prior to surgery.    Based on currently available information, I would anticipate that he can proceed as planned if no acute changes. Denied cocaine use in the past several months. SARS CoV2 test in process.   VS: BP 130/86   Pulse 60   Temp 36.6 C   Resp 18   Ht 5\' 8"  (1.727 m)   Wt 72.2 kg   SpO2 98%   BMI 24.21 kg/m     PROVIDERS: Patient, No Pcp Per  - He saw neurologist Star Age, MD in March 2016 following a single seizure felt likely provoked by cocaine use in setting attributed to [facility] use, excessive caffeine intake (6-12 cans/day), alcohol use (14 cans/week), and smoking (2 PPD).  Brain CT and MRI were negative. An EEG was ordered (but no results seen), but counseled on avoiding illicit drugs and reducing caffeine and was not prescribed any anti-seizure medications. PRN neurology follow-up planned.    LABS: Labs reviewed: Acceptable for surgery. (all labs ordered are listed, but only abnormal results are displayed)  Labs Reviewed  COMPREHENSIVE METABOLIC PANEL - Abnormal; Notable for the following components:      Result Value   Calcium 8.8 (*)    All other components within normal limits  CBC    IMAGES: CXR 10/27/18: IMPRESSION: 1. No active cardiopulmonary disease. 2. Suspected emphysema.  EKG: 10/27/18: Sinus tachycardia at 105 BPM Right atrial enlargement Baseline wanderer. Artifact in V3. Borderline ECG No significant change since 12/30/2014 Confirmed by Veryl Speak [phone]) on 10/27/2018 6:45:23 AM   CV: N/A   Past Medical History:  Diagnosis Date  . Drug abuse ([place])   . History of kidney stones   . Tobacco abuse     Past Surgical History:  Procedure Laterality Date  . WISDOM TOOTH EXTRACTION      MEDICATIONS: . azelastine (ASTELIN)  0.1 % nasal spray  . ibuprofen (ADVIL) 200 MG tablet  . pseudoephedrine-guaifenesin (MUCINEX D) 60-600 MG 12 hr tablet   No current facility-administered medications for this encounter.     Myra Gianotti, PA-C Surgical Short Stay/Anesthesiology [facility] Phone [phone] [facility] Phone [phone] 03/02/2019 4:30 PM

## 2019-03-03 LAB — NOVEL CORONAVIRUS, NAA (HOSP ORDER, SEND-OUT TO REF LAB; TAT 18-24 HRS): SARS-CoV-2, NAA: NOT DETECTED

## 2019-03-04 ENCOUNTER — Inpatient Hospital Stay (HOSPITAL_COMMUNITY): Payer: Self-pay | Admitting: Anesthesiology

## 2019-03-04 ENCOUNTER — Encounter (HOSPITAL_COMMUNITY): Payer: Self-pay | Admitting: *Deleted

## 2019-03-04 ENCOUNTER — Encounter (HOSPITAL_COMMUNITY)
Admission: RE | Disposition: A | Discharge status: Home / Self Care | Payer: Self-pay | Source: Home / Self Care | Attending: Otolaryngology

## 2019-03-04 ENCOUNTER — Other Ambulatory Visit: Payer: Self-pay

## 2019-03-04 ENCOUNTER — Ambulatory Visit (HOSPITAL_COMMUNITY)
Admission: RE | Admit: 2019-03-04 | Discharge: 2019-03-04 | Disposition: A | Discharge status: Home / Self Care | Payer: Self-pay | Attending: Otolaryngology | Admitting: Otolaryngology

## 2019-03-04 ENCOUNTER — Inpatient Hospital Stay (HOSPITAL_COMMUNITY): Payer: Self-pay | Admitting: Physician Assistant

## 2019-03-04 DIAGNOSIS — C770 Secondary and unspecified malignant neoplasm of lymph nodes of head, face and neck: Secondary | ICD-10-CM | POA: Insufficient documentation

## 2019-03-04 DIAGNOSIS — C801 Malignant (primary) neoplasm, unspecified: Secondary | ICD-10-CM | POA: Insufficient documentation

## 2019-03-04 DIAGNOSIS — F17200 Nicotine dependence, unspecified, uncomplicated: Secondary | ICD-10-CM | POA: Insufficient documentation

## 2019-03-04 DIAGNOSIS — Z7951 Long term (current) use of inhaled steroids: Secondary | ICD-10-CM | POA: Insufficient documentation

## 2019-03-04 HISTORY — PX: MASS BIOPSY: SHX5445

## 2019-03-04 HISTORY — PX: EXCISION MASS NECK: SHX6703

## 2019-03-04 HISTORY — PX: TONSILLECTOMY: SHX5217

## 2019-03-04 HISTORY — PX: DIRECT LARYNGOSCOPY: SHX5326

## 2019-03-04 SURGERY — LARYNGOSCOPY, DIRECT
Anesthesia: General | Site: Neck | Laterality: Right

## 2019-03-04 MED ORDER — PROMETHAZINE HCL 25 MG RE SUPP: 25.0000 mg | Freq: Four times a day (QID) | RECTAL | 1 refills | Status: DC | PRN

## 2019-03-04 MED ORDER — HYDROCODONE-ACETAMINOPHEN 7.5-325 MG PO TABS: 1.0000 | ORAL_TABLET | Freq: Four times a day (QID) | ORAL | 0 refills | Status: DC | PRN

## 2019-03-04 MED ORDER — EPINEPHRINE HCL (NASAL) 0.1 % NA SOLN
NASAL | Status: DC | PRN
  Administered 2019-03-04: 1 [drp] via TOPICAL

## 2019-03-04 MED ORDER — DEXMEDETOMIDINE HCL IN NACL 400 MCG/100ML IV SOLN
INTRAVENOUS | Status: DC | PRN
  Administered 2019-03-04 (×2): 8 ug via INTRAVENOUS

## 2019-03-04 MED ORDER — PROPOFOL 10 MG/ML IV BOLUS
INTRAVENOUS | Status: AC
  Filled 2019-03-04: qty 40

## 2019-03-04 MED ORDER — HYDROMORPHONE HCL 1 MG/ML IJ SOLN
0.2500 mg | INTRAMUSCULAR | Status: DC | PRN
  Administered 2019-03-04 (×2): 0.5 mg via INTRAVENOUS

## 2019-03-04 MED ORDER — FENTANYL CITRATE (PF) 250 MCG/5ML IJ SOLN
INTRAMUSCULAR | Status: DC | PRN
  Administered 2019-03-04: 100 ug via INTRAVENOUS
  Administered 2019-03-04 (×2): 50 ug via INTRAVENOUS
  Administered 2019-03-04: 100 ug via INTRAVENOUS

## 2019-03-04 MED ORDER — HYDROMORPHONE HCL 1 MG/ML IJ SOLN
INTRAMUSCULAR | Status: AC
  Filled 2019-03-04: qty 1

## 2019-03-04 MED ORDER — SUCCINYLCHOLINE CHLORIDE 200 MG/10ML IV SOSY
PREFILLED_SYRINGE | INTRAVENOUS | Status: AC
  Filled 2019-03-04: qty 10

## 2019-03-04 MED ORDER — PHENYLEPHRINE 40 MCG/ML (10ML) SYRINGE FOR IV PUSH (FOR BLOOD PRESSURE SUPPORT)
PREFILLED_SYRINGE | INTRAVENOUS | Status: AC
  Filled 2019-03-04: qty 20

## 2019-03-04 MED ORDER — 0.9 % SODIUM CHLORIDE (POUR BTL) OPTIME
TOPICAL | Status: DC | PRN
  Administered 2019-03-04 (×2): 1000 mL

## 2019-03-04 MED ORDER — EPHEDRINE SULFATE-NACL 50-0.9 MG/10ML-% IV SOSY
PREFILLED_SYRINGE | INTRAVENOUS | Status: DC | PRN
  Administered 2019-03-04: 10 mg via INTRAVENOUS

## 2019-03-04 MED ORDER — PHENYLEPHRINE 40 MCG/ML (10ML) SYRINGE FOR IV PUSH (FOR BLOOD PRESSURE SUPPORT)
PREFILLED_SYRINGE | INTRAVENOUS | Status: DC | PRN
  Administered 2019-03-04 (×10): 80 ug via INTRAVENOUS

## 2019-03-04 MED ORDER — BACITRACIN ZINC 500 UNIT/GM EX OINT
TOPICAL_OINTMENT | CUTANEOUS | Status: AC
  Filled 2019-03-04: qty 28.35

## 2019-03-04 MED ORDER — ONDANSETRON HCL 4 MG/2ML IJ SOLN
INTRAMUSCULAR | Status: DC | PRN
  Administered 2019-03-04: 4 mg via INTRAVENOUS

## 2019-03-04 MED ORDER — LIDOCAINE-EPINEPHRINE 1 %-1:100000 IJ SOLN
INTRAMUSCULAR | Status: AC
  Filled 2019-03-04: qty 1

## 2019-03-04 MED ORDER — ONDANSETRON HCL 4 MG/2ML IJ SOLN
INTRAMUSCULAR | Status: AC
  Filled 2019-03-04: qty 2

## 2019-03-04 MED ORDER — SUCCINYLCHOLINE CHLORIDE 200 MG/10ML IV SOSY
PREFILLED_SYRINGE | INTRAVENOUS | Status: DC | PRN
  Administered 2019-03-04: 100 mg via INTRAVENOUS

## 2019-03-04 MED ORDER — LACTATED RINGERS IV SOLN: INTRAVENOUS | Status: DC

## 2019-03-04 MED ORDER — FENTANYL CITRATE (PF) 250 MCG/5ML IJ SOLN
INTRAMUSCULAR | Status: AC
  Filled 2019-03-04: qty 5

## 2019-03-04 MED ORDER — LIDOCAINE-EPINEPHRINE 1 %-1:100000 IJ SOLN
INTRAMUSCULAR | Status: DC | PRN
  Administered 2019-03-04: 2 mL

## 2019-03-04 MED ORDER — MEPERIDINE HCL 25 MG/ML IJ SOLN: 6.2500 mg | INTRAMUSCULAR | Status: DC | PRN

## 2019-03-04 MED ORDER — EPINEPHRINE HCL (NASAL) 0.1 % NA SOLN
NASAL | Status: AC
  Filled 2019-03-04: qty 30

## 2019-03-04 MED ORDER — LIDOCAINE 2% (20 MG/ML) 5 ML SYRINGE
INTRAMUSCULAR | Status: DC | PRN
  Administered 2019-03-04: 60 mg via INTRAVENOUS

## 2019-03-04 MED ORDER — MIDAZOLAM HCL 5 MG/5ML IJ SOLN
INTRAMUSCULAR | Status: DC | PRN
  Administered 2019-03-04: 2 mg via INTRAVENOUS

## 2019-03-04 MED ORDER — MIDAZOLAM HCL 2 MG/2ML IJ SOLN
INTRAMUSCULAR | Status: AC
  Filled 2019-03-04: qty 2

## 2019-03-04 MED ORDER — PROMETHAZINE HCL 25 MG/ML IJ SOLN: 6.2500 mg | INTRAMUSCULAR | Status: DC | PRN

## 2019-03-04 MED ORDER — DEXAMETHASONE SODIUM PHOSPHATE 10 MG/ML IJ SOLN
INTRAMUSCULAR | Status: DC | PRN
  Administered 2019-03-04: 10 mg via INTRAVENOUS

## 2019-03-04 MED ORDER — MIDAZOLAM HCL 2 MG/2ML IJ SOLN: 0.5000 mg | Freq: Once | INTRAMUSCULAR | Status: DC | PRN

## 2019-03-04 MED ORDER — DEXMEDETOMIDINE HCL IN NACL 200 MCG/50ML IV SOLN
INTRAVENOUS | Status: AC
  Filled 2019-03-04: qty 50

## 2019-03-04 MED ORDER — LACTATED RINGERS IV SOLN
INTRAVENOUS | Status: DC | PRN
  Administered 2019-03-04 (×2): via INTRAVENOUS

## 2019-03-04 MED ORDER — BACITRACIN ZINC 500 UNIT/GM EX OINT
TOPICAL_OINTMENT | CUTANEOUS | Status: DC | PRN
  Administered 2019-03-04: 1 via TOPICAL

## 2019-03-04 MED ORDER — DEXAMETHASONE SODIUM PHOSPHATE 10 MG/ML IJ SOLN
INTRAMUSCULAR | Status: AC
  Filled 2019-03-04: qty 1

## 2019-03-04 MED ORDER — SODIUM CHLORIDE 0.9 % IV SOLN
INTRAVENOUS | Status: DC | PRN
  Administered 2019-03-04: 25 ug/min via INTRAVENOUS

## 2019-03-04 MED ORDER — LIDOCAINE 2% (20 MG/ML) 5 ML SYRINGE
INTRAMUSCULAR | Status: AC
  Filled 2019-03-04: qty 5

## 2019-03-04 MED ORDER — PROPOFOL 10 MG/ML IV BOLUS
INTRAVENOUS | Status: DC | PRN
  Administered 2019-03-04: 200 mg via INTRAVENOUS

## 2019-03-04 MED ORDER — EPHEDRINE 5 MG/ML INJ
INTRAVENOUS | Status: AC
  Filled 2019-03-04: qty 10

## 2019-03-04 SURGICAL SUPPLY — 42 items
ADH SKN CLS APL DERMABOND .7 (GAUZE/BANDAGES/DRESSINGS) ×2
BLADE SURG 15 STRL LF DISP TIS (BLADE) IMPLANT
BLADE SURG 15 STRL SS (BLADE) ×4
CANISTER SUCT 3000ML PPV (MISCELLANEOUS) ×4 IMPLANT
CATH ROBINSON RED A/P 12FR (CATHETERS) ×2 IMPLANT
CLEANER TIP ELECTROSURG 2X2 (MISCELLANEOUS) ×4 IMPLANT
COAGULATOR SUCT 8FR VV (MISCELLANEOUS) ×2 IMPLANT
CONT SPEC 4OZ CLIKSEAL STRL BL (MISCELLANEOUS) ×2 IMPLANT
COVER BACK TABLE 60X90IN (DRAPES) ×4 IMPLANT
COVER MAYO STAND STRL (DRAPES) ×4 IMPLANT
COVER SURGICAL LIGHT HANDLE (MISCELLANEOUS) ×4 IMPLANT
COVER WAND RF STERILE (DRAPES) ×4 IMPLANT
DERMABOND ADVANCED (GAUZE/BANDAGES/DRESSINGS) ×2
DERMABOND ADVANCED .7 DNX12 (GAUZE/BANDAGES/DRESSINGS) IMPLANT
DRAIN PENROSE 1/4X12 LTX STRL (WOUND CARE) ×2 IMPLANT
DRAPE HALF SHEET 40X57 (DRAPES) ×4 IMPLANT
ELECT COATED BLADE 2.86 ST (ELECTRODE) ×4 IMPLANT
GAUZE 4X4 16PLY RFD (DISPOSABLE) ×4 IMPLANT
GAUZE SPONGE 4X4 12PLY STRL (GAUZE/BANDAGES/DRESSINGS) ×2 IMPLANT
GLOVE ECLIPSE 7.5 STRL STRAW (GLOVE) ×4 IMPLANT
GUARD TEETH (MISCELLANEOUS) ×2 IMPLANT
KIT BASIN OR (CUSTOM PROCEDURE TRAY) ×6 IMPLANT
KIT TURNOVER KIT B (KITS) ×4 IMPLANT
NEEDLE 27GAX1X1/2 (NEEDLE) ×4 IMPLANT
NS IRRIG 1000ML POUR BTL (IV SOLUTION) ×4 IMPLANT
PAD ARMBOARD 7.5X6 YLW CONV (MISCELLANEOUS) ×8 IMPLANT
PATTIES SURGICAL .5 X3 (DISPOSABLE) ×2 IMPLANT
PENCIL BUTTON HOLSTER BLD 10FT (ELECTRODE) ×2 IMPLANT
PENCIL FOOT CONTROL (ELECTRODE) ×4 IMPLANT
SOLUTION ANTI FOG 6CC (MISCELLANEOUS) IMPLANT
SUT CHROMIC 3 0 SH 27 (SUTURE) ×2 IMPLANT
SUT SILK 2 0SH CR/8 30 (SUTURE) ×2 IMPLANT
SUT SILK 4 0 REEL (SUTURE) ×2 IMPLANT
SYR BULB 3OZ (MISCELLANEOUS) ×4 IMPLANT
SYR CONTROL 10ML LL (SYRINGE) ×2 IMPLANT
TAPE PAPER 3X10 WHT MICROPORE (GAUZE/BANDAGES/DRESSINGS) ×2 IMPLANT
TOWEL OR 17X26 10 PK STRL BLUE (TOWEL DISPOSABLE) ×4 IMPLANT
TRAY ENT MC OR (CUSTOM PROCEDURE TRAY) ×4 IMPLANT
TUBE CONNECTING 12'X1/4 (SUCTIONS) ×2
TUBE CONNECTING 12X1/4 (SUCTIONS) ×4 IMPLANT
TUBE SALEM SUMP 12R W/ARV (TUBING) ×2 IMPLANT
YANKAUER SUCT BULB TIP NO VENT (SUCTIONS) ×2 IMPLANT

## 2019-03-04 NOTE — Op Note (Signed)
OPERATIVE REPORT  DATE OF SURGERY: 03/04/2019  PATIENT:  Zachary Reeves,  53 y.o. male  PRE-OPERATIVE DIAGNOSIS:  Neck mass  POST-OPERATIVE DIAGNOSIS:  Neck mass  PROCEDURE:  Procedure(s): DIRECT LARYNGOSCOPY Right Tonsillectomy, Frozen Biospy of Tonsil Neck Mass Biopsy with Frozen section  Excision Mass Neck  SURGEON:  Beckie Salts, MD  ASSISTANTS: None  ANESTHESIA:   General   EBL: 30 ml  DRAINS: Quarter-inch Penrose  LOCAL MEDICATIONS USED: 1% Xylocaine with epinephrine used on the pharyngeal through cut biopsy  SPECIMEN: 1.  Right tonsil, frozen section negative for carcinoma. 2.  Tru-Cut biopsy of right pharyngeal mass, transoral, negative for carcinoma. 3.  Right neck mass excisional biopsy  COUNTS:  Correct  PROCEDURE DETAILS: The patient was taken to the operating room and placed on the operating table in the supine position. Following induction of general endotracheal anesthesia, the table was turned 90 degrees and the patient was positioned for endoscopy.  1.  Direct laryngoscopy was performed using a Jako laryngoscope.  The patient is edentulous.  The larynx, hypopharynx, esophageal introitus, oropharynx were all inspected and palpated.  There were no obvious mucosal abnormalities.  The right tonsil appeared to be slightly larger than the left.  No biopsies were taken.  2.  Right tonsillectomy.  A mouthgag was inserted and positioned, attached to the Mayo stand with a rake.  A right tonsillectomy was performed using electrocautery dissection.  There is no bleeding.  Specimen was sent for pathologic evaluation using frozen section and analysis revealed no evidence of carcinoma.  A suture was placed at the superior pole for orientation purposes.  3.  With external palpation on the right level 2 mass, it was apparent that the mass was easily palpable through the tonsillar bed in the posterior lateral aspect of the oropharynx.  Xylocaine with epinephrine was infiltrated  into the soft tissues and a Tru-Cut biopsy device was used in multiple passes to take a core biopsy of this mass.  These were sent for frozen section analysis and were negative for carcinoma. Mild bleeding was controlled with pressure and there were no further complications.  4.  The table was then turned back to its native position.  The right neck was prepped and draped in a standard fashion.  A curvilinear incision was outlined about 2 fingerbreadths below the angle of the mandible and a cervical skin crease.  Electrocautery was used to incise the skin and subcutaneous tissue.  Platysma was divided.  The cervical branch of the facial nerve was identified and preserved.  The external jugular vein was ligated between clamps and divided.  4-0 silk ties were used.  Deep dissection then revealed the level 2 mass.  It was about 4 to 5 cm in greatest dimension ovoid in shape, white in color and firm.  It was carefully dissected from all surrounding tissue.  Small vessels were ligated.  No other cranial nerves were identified during the dissection.  The mass was removed in its entirety and sent for pathologic evaluation.  The wound was irrigated with saline.  1/4 inch Penrose was placed into the wound exited anteriorly and not sutured into place.  The platysma was reapproximated with interrupted 3-0 chromic.  The skin was reapproximated with a running 3-0 chromic.  Bacitracin and a dressing were applied.  The patient was awakened extubated and transferred to recovery in stable condition.  PATIENT DISPOSITION:  To PACU, stable

## 2019-03-04 NOTE — Interval H&P Note (Signed)
History and Physical Interval Note:  03/04/2019 8:23 AM  Zachary Reeves  has presented today for surgery, with the diagnosis of Neck mass.  The various methods of treatment have been discussed with the patient and family. After consideration of risks, benefits and other options for treatment, the patient has consented to  Procedure(s): DIRECT LARYNGOSCOPY, biopsy of tonsil with frozen section, possible tonsillectomy, possible core biopsy of neck (Right) as a surgical intervention.  The patient's history has been reviewed, patient examined, no change in status, stable for surgery.  I have reviewed the patient's chart and labs.  Questions were answered to the patient's satisfaction.     Izora Gala

## 2019-03-04 NOTE — Discharge Instructions (Signed)
Stay on a soft diet.  If you have any bleeding from the throat, gargle with a mixture of half-and-half ice water and hydrogen peroxide.  If the bleeding is heavy or does not stop come to the emergency department immediately and contact our office.  Remove the dressing from the neck Thursday morning.  There is a rubber drain that can easily be pulled out of the wound.  Keep a dressing on for a day or 2 until there is no further drainage.  It will be okay to shower on Friday or Saturday but keep the area dry and clean.   Post Anesthesia Home Care Instructions  Activity: Get plenty of rest for the remainder of the day. A responsible individual must stay with you for 24 hours following the procedure.  For the next 24 hours, DO NOT: -Drive a car -Paediatric nurse -Drink alcoholic beverages -Take any medication unless instructed by your physician -Make any legal decisions or sign important papers.  Meals: Start with liquid foods such as gelatin or soup. Progress to regular foods as tolerated. Avoid greasy, spicy, heavy foods. If nausea and/or vomiting occur, drink only clear liquids until the nausea and/or vomiting subsides. Call your physician if vomiting continues.  Special Instructions/Symptoms: Your throat may feel dry or sore from the anesthesia or the breathing tube placed in your throat during surgery. If this causes discomfort, gargle with warm salt water. The discomfort should disappear within 24 hours.  If you had a scopolamine patch placed behind your ear for the management of post- operative nausea and/or vomiting:  1. The medication in the patch is effective for 72 hours, after which it should be removed.  Wrap patch in a tissue and discard in the trash. Wash hands thoroughly with soap and water. 2. You may remove the patch earlier than 72 hours if you experience unpleasant side effects which may include dry mouth, dizziness or visual disturbances. 3. Avoid touching the patch.  Wash your hands with soap and water after contact with the patch.

## 2019-03-04 NOTE — Anesthesia Postprocedure Evaluation (Signed)
Anesthesia Post Note  Patient: MIKAH POSS  Procedure(s) Performed: DIRECT LARYNGOSCOPY (Right Mouth) Right Tonsillectomy, Frozen Biospy of Tonsil (Right Mouth) Neck Mass Biopsy with Frozen section  (Right Mouth) Excision Mass Neck (Right Neck)     Patient location during evaluation: PACU Anesthesia Type: General Level of consciousness: awake and alert, patient cooperative and oriented Pain management: pain level controlled Vital Signs Assessment: post-procedure vital signs reviewed and stable Respiratory status: spontaneous breathing, nonlabored ventilation and respiratory function stable Cardiovascular status: blood pressure returned to baseline and stable Postop Assessment: no apparent nausea or vomiting Anesthetic complications: no    Last Vitals:  Vitals:   03/04/19 1135 03/04/19 1151  BP: (!) 133/99 105/64  Pulse: 76 72  Resp: (!) 27   Temp:    SpO2: 94% 97%    Last Pain:  Vitals:   03/04/19 1151  TempSrc:   PainSc: 2                  Renold Kozar,E. Kaamil Morefield

## 2019-03-04 NOTE — Anesthesia Procedure Notes (Signed)
Procedure Name: Intubation Date/Time: 03/04/2019 8:32 AM Performed by: Renato Shin, CRNA Pre-anesthesia Checklist: Patient identified, Emergency Drugs available, Suction available and Patient being monitored Patient Re-evaluated:Patient Re-evaluated prior to induction Oxygen Delivery Method: Circle system utilized Preoxygenation: Pre-oxygenation with 100% oxygen Induction Type: IV induction and Rapid sequence Laryngoscope Size: Miller and 3 Grade View: Grade I Tube type: Oral Tube size: 7.5 mm Number of attempts: 1 Airway Equipment and Method: Stylet and Oral airway Placement Confirmation: ETT inserted through vocal cords under direct vision,  positive ETCO2 and breath sounds checked- equal and bilateral Secured at: 21 cm Tube secured with: Tape Dental Injury: Teeth and Oropharynx as per pre-operative assessment

## 2019-03-04 NOTE — Transfer of Care (Signed)
Immediate Anesthesia Transfer of Care Note  Patient: Zachary Reeves  Procedure(s) Performed: DIRECT LARYNGOSCOPY (Right Mouth) Right Tonsillectomy, Frozen Biospy of Tonsil (Right Mouth) Neck Mass Biopsy with Frozen section  (Right Mouth) Excision Mass Neck (Right Neck)  Patient Location: PACU  Anesthesia Type:General  Level of Consciousness: awake, drowsy and patient cooperative  Airway & Oxygen Therapy: Patient Spontanous Breathing and Patient connected to face mask oxygen  Post-op Assessment: Report given to RN and Post -op Vital signs reviewed and stable  Post vital signs: Reviewed and stable  Last Vitals:  Vitals Value Taken Time  BP 134/87 03/04/2019 10:50 AM  Temp    Pulse 84 03/04/2019 10:53 AM  Resp 14 03/04/2019 10:53 AM  SpO2 95 % 03/04/2019 10:53 AM  Vitals shown include unvalidated device data.  Last Pain:  Vitals:   03/04/19 0653  TempSrc: Oral  PainSc: 0-No pain      Patients Stated Pain Goal: 4 (95/18/84 1660)  Complications: No apparent anesthesia complications

## 2019-03-05 ENCOUNTER — Encounter (HOSPITAL_COMMUNITY): Payer: Self-pay | Admitting: Otolaryngology

## 2019-03-11 ENCOUNTER — Other Ambulatory Visit: Payer: Self-pay | Admitting: Otolaryngology

## 2019-03-11 ENCOUNTER — Other Ambulatory Visit: Payer: Self-pay

## 2019-03-11 ENCOUNTER — Encounter (HOSPITAL_COMMUNITY): Payer: Self-pay | Admitting: Dentistry

## 2019-03-11 ENCOUNTER — Ambulatory Visit (HOSPITAL_COMMUNITY): Payer: Self-pay | Admitting: Dentistry

## 2019-03-11 ENCOUNTER — Other Ambulatory Visit (HOSPITAL_COMMUNITY): Payer: Self-pay | Admitting: Otolaryngology

## 2019-03-11 VITALS — BP 116/86 | HR 76 | Temp 98.4°F

## 2019-03-11 DIAGNOSIS — R930 Abnormal findings on diagnostic imaging of skull and head, not elsewhere classified: Secondary | ICD-10-CM

## 2019-03-11 DIAGNOSIS — K08409 Partial loss of teeth, unspecified cause, unspecified class: Secondary | ICD-10-CM

## 2019-03-11 DIAGNOSIS — C801 Malignant (primary) neoplasm, unspecified: Secondary | ICD-10-CM

## 2019-03-11 DIAGNOSIS — M2632 Excessive spacing of fully erupted teeth: Secondary | ICD-10-CM

## 2019-03-11 DIAGNOSIS — C7989 Secondary malignant neoplasm of other specified sites: Secondary | ICD-10-CM

## 2019-03-11 DIAGNOSIS — K0889 Other specified disorders of teeth and supporting structures: Secondary | ICD-10-CM

## 2019-03-11 DIAGNOSIS — L538 Other specified erythematous conditions: Secondary | ICD-10-CM

## 2019-03-11 DIAGNOSIS — K0820 Unspecified atrophy of edentulous alveolar ridge: Secondary | ICD-10-CM

## 2019-03-11 DIAGNOSIS — C760 Malignant neoplasm of head, face and neck: Secondary | ICD-10-CM | POA: Insufficient documentation

## 2019-03-11 DIAGNOSIS — M264 Malocclusion, unspecified: Secondary | ICD-10-CM

## 2019-03-11 DIAGNOSIS — M270 Developmental disorders of jaws: Secondary | ICD-10-CM

## 2019-03-11 DIAGNOSIS — K06010 Localized gingival recession, unspecified: Secondary | ICD-10-CM

## 2019-03-11 DIAGNOSIS — K045 Chronic apical periodontitis: Secondary | ICD-10-CM

## 2019-03-11 DIAGNOSIS — K0530 Chronic periodontitis, unspecified: Secondary | ICD-10-CM

## 2019-03-11 DIAGNOSIS — K030 Excessive attrition of teeth: Secondary | ICD-10-CM

## 2019-03-11 DIAGNOSIS — Z98811 Dental restoration status: Secondary | ICD-10-CM

## 2019-03-11 DIAGNOSIS — K036 Deposits [accretions] on teeth: Secondary | ICD-10-CM

## 2019-03-11 DIAGNOSIS — Z01818 Encounter for other preprocedural examination: Secondary | ICD-10-CM

## 2019-03-11 NOTE — Progress Notes (Signed)
DENTAL CONSULTATION  Date of Consultation:  03/11/2019 Patient Name:   Zachary Reeves Date of Birth:   1966-07-06 Medical Record Number: [id number]  VITALS: BP 116/86 (BP Location: Right Arm)   Pulse 76   Temp 98.4 F (36.9 C)   CHIEF COMPLAINT: Patient referred by Dr. Constance Holster for a dental consultation.  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 Patient currently has an unknown primary tumor. Patient with pending PET scan to help identify the primary tumor. Patient with anticipated radiation therapy and possible chemotherapy with consultations pending. Patient is now seen as part of a medically necessary pre-chemoradiation therapy dental protocol examination.  The patient currently denies acute toothaches, swellings, or abscesses. Patient last saw a dentist in [place] approximately one year ago to have 4 teeth extracted. There were no complications with the dental extractions. The patient then returned to [facility] and had an upper complete denture fabricated at [facility]. Patient indicated that he was never able to wear the upper complete denture successfully. The patient does not wear the complete denture.  Patient denies having a lower partial denture. Patient denies having dental phobia.  PROBLEM LIST: Patient Active Problem List   Diagnosis Date Noted  . Cancer of RIGHT neck ([place]) 03/11/2019    Priority: High    PMH: Past Medical History:  Diagnosis Date  . Drug abuse ([place])   . History of kidney stones    passed stones  . Tobacco abuse     PSH: Past Surgical History:  Procedure Laterality Date  . DIRECT LARYNGOSCOPY Right 03/04/2019   Procedure: DIRECT LARYNGOSCOPY;  Surgeon: Izora Gala, MD;  Location: [place];  Service: ENT;  Laterality: Right;  . EXCISION MASS NECK Right 03/04/2019   Procedure: Excision Mass Neck;  Surgeon: Izora Gala, MD;  Location: [place];  Service: ENT;  Laterality: Right;  . EYE SURGERY      left eye as a child  . MASS BIOPSY Right 03/04/2019   Procedure: Neck Mass Biopsy with Frozen section ;  Surgeon: Izora Gala, MD;  Location: [place];  Service: ENT;  Laterality: Right;  . TONSILLECTOMY Right 03/04/2019   Procedure: Right Tonsillectomy, Frozen Biospy of Tonsil;  Surgeon: Izora Gala, MD;  Location: [place];  Service: ENT;  Laterality: Right;  . WISDOM TOOTH EXTRACTION      ALLERGIES: Allergies  Allergen Reactions  . Penicillins Other (See Comments)    UNSPECIFIED CHILDHOOD REACTION  Did it involve swelling of the face/tongue/throat, SOB, or low BP? Unknown Did it involve sudden or severe rash/hives, skin peeling, or any reaction on the inside of your mouth or nose? Unknown Did you need to seek medical attention at a hospital or doctor's office? Unknown When did it last happen?childhood allergy If all above answers are "NO", may proceed with cephalosporin use.     MEDICATIONS: Current Outpatient Medications  Medication Sig Dispense Refill  . azelastine (ASTELIN) 0.1 % nasal spray Place 1 spray into both nostrils 2 (two) times daily as needed for rhinitis. Use in each nostril as directed    . HYDROcodone-acetaminophen (NORCO) 7.5-325 MG tablet Take 1 tablet by mouth every 6 (six) hours as needed for moderate pain. 20 tablet 0  . ibuprofen (ADVIL) 200 MG tablet Take 600 mg by mouth every 6 (six) hours as needed for headache or moderate pain.    . promethazine (PHENERGAN) 25 MG suppository Place 1 suppository (25 mg total)  rectally every 6 (six) hours as needed for nausea or vomiting. 12 suppository 1  . pseudoephedrine-guaifenesin (MUCINEX D) 60-600 MG 12 hr tablet Take 1 tablet by mouth 2 (two) times daily as needed for congestion.     No current facility-administered medications for this visit.     LABS: Lab Results  Component Value Date   WBC 7.8 03/02/2019   HGB 15.1 03/02/2019   HCT 45.4 03/02/2019   MCV 87.3 03/02/2019   PLT 211 03/02/2019       Component Value Date/Time   NA 138 03/02/2019 0827   K 4.3 03/02/2019 0827   CL 108 03/02/2019 0827   CO2 22 03/02/2019 0827   GLUCOSE 99 03/02/2019 0827   BUN 15 03/02/2019 0827   CREATININE 0.89 03/02/2019 0827   CALCIUM 8.8 (L) 03/02/2019 0827   GFRNONAA >60 03/02/2019 0827   GFRAA >60 03/02/2019 0827   No results found for: INR, PROTIME No results found for: PTT  SOCIAL HISTORY: Social History   Socioeconomic History  . Marital status: Married    Spouse name: Not on file  . Number of children: 1  . Years of education: 7th grade  . Highest education level: Not on file  Occupational History  . Not on file  Social Needs  . Financial resource strain: Not on file  . Food insecurity:    Worry: Not on file    Inability: Not on file  . Transportation needs:    Medical: Not on file    Non-medical: Not on file  Tobacco Use  . Smoking status: Current Every Day Smoker    Packs/day: 2.00  . Smokeless tobacco: Never Used  Substance and Sexual Activity  . Alcohol use: Yes    Alcohol/week: 14.0 standard drinks    Types: 14 Cans of beer per week  . Drug use: Yes    Types: Cocaine, Marijuana    Comment: last use marijuana 02/20/2019, last use cocaine 12/2018  . Sexual activity: Not on file  Lifestyle  . Physical activity:    Days per week: Not on file    Minutes per session: Not on file  . Stress: Not on file  Relationships  . Social connections:    Talks on phone: Not on file    Gets together: Not on file    Attends religious service: Not on file    Active member of club or organization: Not on file    Attends meetings of clubs or organizations: Not on file    Relationship status: Not on file  . Intimate partner violence:    Fear of current or ex partner: Not on file    Emotionally abused: Not on file    Physically abused: Not on file    Forced sexual activity: Not on file  Other Topics Concern  . Not on file  Social History Narrative  . Not on file    FAMILY  HISTORY: Family History  Problem Relation Age of Onset  . COPD Mother   . Cancer Father     REVIEW OF SYSTEMS: Reviewed with the patient as per History of present illness. Psych: Patient denies having dental phobia.  DENTAL HISTORY: CHIEF COMPLAINT: Patient referred by Dr. Constance Holster for a dental consultation.  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 Patient currently has an unknown primary tumor. Patient with pending PET scan to help identify the primary tumor. Patient with anticipated radiation therapy  and possible chemotherapy with consultations pending. Patient is now seen as part of a medically necessary pre-chemoradiation therapy dental protocol examination.  The patient currently denies acute toothaches, swellings, or abscesses. Patient last saw a dentist in [place] approximately one year ago to have 4 teeth extracted. There were no complications with the dental extractions. The patient then returned to [facility] and had an upper complete denture fabricated at [facility]. Patient indicated that he was never able to wear the upper complete denture successfully. The patient does not wear the complete denture.  Patient denies having a lower partial denture. Patient denies having dental phobia.   DENTAL EXAMINATION: GENERAL:  The patient is a well-developed, well-nourished male in no acute distress. HEAD AND NECK:  The patient has a right neck scar consistent with right neck mass removal. Patient denies acute TMJ symptoms.  Patient has a maximum interincisal opening of 55 mm measured from the maxillary edentulous ridge to lower anterior incisal edge. INTRAORAL EXAM: The patient has normal saliva. The patient has an edentulous maxilla and the soft tissues of the hard and soft palate are erythematous and edematous. The patient has bilateral mandibular lingual tori. There is no evidence of oral abscess formation. DENTITION:   The patient is missing all maxillary teeth (1-16) as well as tooth numbers 17, 18, 30, 31, and 32. PERIODONTAL: The patient has chronic, advanced periodontal disease with plaque and calculus accumulations, generalized gingival recession, and generalized tooth mobility. There is moderate to severe bone loss noted. Radiographic calculus is noted. There is furcation involvement of the lower left molar #19. DENTAL CARIES/SUBOPTIMAL RESTORATIONS: No significant dental caries are noted. There is evidence of the incisal and occlusal attrition. ENDODONTIC: The patient currently denies acute pulpitis symptoms. There does appear to be incipient periapical radiolucency associated with the apices of tooth numbers 24 and 25. CROWN AND BRIDGE:  There are no crown or bridge restorations noted. PROSTHODONTIC:  Patient has an upper complete denture. Patient denies having a lower partial denture. The upper complete denture is ill fitting. OCCLUSION:  Patient has a poor occlusal scheme secondary to multiple missing teeth, multiple diastemas, and lack of replacement of all missing teeth with clinically acceptable dental prostheses.  RADIOGRAPHIC INTERPRETATION: An orthopantogram was taken and supplemented with 7 lower periapical radiographs. There are multiple missing teeth numbers 1 through 16, 17, 18, and 30-32. There is moderate to severe bone loss. Radiographic calculus is noted. Multiple diastemas are noted. There is supra-eruption and drifting of the unopposed teeth into the edentulous areas. There is probable furcation involvement of tooth #19. ncisal and occlusal attrition are noted.  There is atrophy of the edentulous alveolar ridges. There is opacification of the bilateral maxillary sinuses.   ASSESSMENTS: 1. Squamous cell carcinoma of the right neck-with current unknown primary tumor.. 2. Pre-chemoradiation therapy dental protocol examination 3. Chronic, advanced periodontal disease with bone loss 4.  Generalized gingival recession 5. Accretions 6. Generalized tooth mobility 7. Chronic apical periodontitis 8. Excessive incisal and occlusal attrition 9. Multiple missing teeth 10. Atrophy of the edentulous alveolar ridges 11. Erythema of the soft and hard palatal tissues 12. Ill fitting maxillary complete denture 13. Poor occlusal scheme and malocclusion 14. Opacification of the bilateral maxillary sinuses 15. Bilateral mandibular lingual tori  PLAN/RECOMMENDATIONS: 1. I discussed the risks, benefits, and complications of various treatment options with the patient in relationship to his medical and dental conditions, anticipated radiation therapy and possible chemotherapy, and chemotherapy side effects to include xerostomia, radiation caries,  trismus, mucositis, taste changes, gum and jawbone changes, and risk for infection and osteoradionecrosis.   We discussed various treatment options to include no treatment, multiple extractions with alveoloplasty, pre-prosthetic surgery as indicated, periodontal therapy, dental restorations, root canal therapy, crown and bridge therapy, implant therapy, and replacement of missing teeth as indicated. The patient currently wishes to proceed with extraction of remaining teeth with alveoloplasty and pre-prosthetic surgery as needed in the operating room with general anesthesia. The patient is aware that he will need a another Covid 19 test prior to proceeding with surgery in the operating room.  The patient will then proceed with the general dentist of his choice for fabrication of new upper and lower complete dentures after adequate healing and approximately 3 months after the last radiation therapy has been given.  His current treatment plan, however, will be deferred until patient has obtained his CT scan/PET scan and has been evaluated by medical and radiation oncologists.  Dr. Constance Holster was contacted concerning erythema associated with the hard and soft palate as  well as the bilateral opacification of the maxillary sinuses. Dr. Constance Holster will evaluate further after review of CT/PET scan.   2. Discussion of findings with medical team and coordination of future medical and dental care as needed.  I spent in excess of  120 minutes during the conduct of this consultation and >50% of this time involved direct face-to-face encounter for counseling and/or coordination of the patient's care.    Lenn Cal, DDS

## 2019-03-11 NOTE — Patient Instructions (Addendum)
COVID-19 Education: The signs and symptoms of COVID-19 were discussed with the patient and how to seek care for testing (follow up with PCP or arrange E-visit).   The importance of social distancing was discussed today.  RADIATION THERAPY AND DECISIONS REGARDING YOUR TEETH  Xerostomia (dry mouth) Your salivary glands may be in the filed of radiation.  Radiation may include all or part of your saliva glands.  This will cause your saliva to dry up and you will have a dry mouth.  The dry mouth will be for the rest of your life unless your radiation oncologist tells you otherwise.  Your saliva has many functions:  Saliva wets your tongue for speaking.  It coats your teeth and the inside of your mouth for easier movement.  It helps with chewing and swallowing food.  It helps clean away harmful acid and toxic products made by the germs in your mouth, therefore it helps prevent cavities.  It kills some germs in your mouth and helps to prevent gum disease.  It helps to carry flavor to your taste buds.  Once you have lost your saliva you will be at higher risk for tooth decay and gum disease.  What can be done to help improve your mouth when there's not enough saliva:  1.  Your dentist may give a prescription for Salagen.  It will not bring back all of your saliva but may bring back some of it.  Also your saliva may be thick and ropy or white and foamy. It will not feel like it use to feel.  2.  You will need to swish with water every time your mouth feels dry.  YOU CANNOT suck on any cough drops, mints, lemon drops, candy, vitamin C or any other products.  You cannot use anything other than water to make your mouth feel less dry.  If you want to drink anything else you have to drink it all at once and brush afterwards.  Be sure to discuss the details of your diet habits with your dentist or hygienist.  Radiation caries: This is decay that happens very quickly once your mouth is very dry due to  radiation therapy.  Normally cavities take six months to two years to become a problem.  When you have dry mouth cavities may take as little as eight weeks to cause you a problem.  This is why dental check ups every two months are necessary as long as you have a dry mouth. Radiation caries typically, but not always, start at your gum line where it is hard to see the cavity.  It is therefore also hard to fill these cavities adequately.  This high rate of cavities happens because your mouth no longer has saliva and therefore the acid made by the germs starts the decay process.  Whenever you eat anything the germs in your mouth change the food into acid.  The acid then burns a small hole in your tooth.  This small hole is the beginning of a cavity.  If this is not treated then it will grow bigger and become a cavity.  The way to avoid this hole getting bigger is to use fluoride every evening as prescribed by your dentist.  You have to make sure that your teeth are very clean before you use the fluoride.  This fluoride in turn will strengthen your teeth and prepare them for another day of fighting acid.  If you develop radiation caries many times the damage is   so large that you will have to have all your teeth removed.  This could be a big problem if some of these teeth are in the field of radiation.  Further details of why this could be a big problem will follow.  (See Osteoradionecrosis).  Loss of taste (dysgeusia) This happens to varying degrees once you've had radiation therapy to your jaw region.  Many times taste is not completely lost but becomes limited.  The loss of taste is mostly due to radiation affecting your taste buds.  However if you have no saliva in your mouth to carry the flavor to your taste buds it would be difficult for your taste buds to taste anything.  That is why using water or a prescription for Salagen prior to meals and during meals may help with some of the taste.  Keep in mind that  taste generally returns very slowly over the course of several months or several years after radiation therapy.  Don't give up hope.  Trismus According to your Radiation Oncologist your TMJ or jaw joints are going to be partially or fully in the field of radiation.  This means that over time the muscles that help you open and close your mouth may get stiff.  This will potentially result in your not being able to open your mouth wide enough or as wide as you can open it now.  Le me give you an example of how slowly this happens and how unaware people are of it.  A gentlemen that had radiation therapy two years ago came back to me complaining that bananas are just too large for him to be able to fit them in between his teeth.  He was not able to open wide enough to bite into a banana.  This happens slowly and over a period of time.  What do we do to try and prevent this?  Your dentist will probably give you a stack of sticks called a trismus exercise device .  This stack will help your remind your muscles and your jaw joint to open up to the same distance every day.  Use these sticks every morning when you wake up according to the instructions given by the dentist.   You must use these sticks for at least one to two years after radiation therapy.  The reason for that is because it happens so slowly and keeps going on for about two years after radiation therapy.  Your hospital dentist will help you monitor your mouth opening and make sure that it's not getting smaller.  Osteoradionecrosis (ORN) This is a condition where your jaw bone after having had radiation therapy becomes very dry.  It has very little blood supply to keep it alive.  If you develop a cavity that turns into an abscess or an infection then the jaw bone does not have enough blood supply to help fight the infection.  At this point it is very likely that the infection could cause the death of your jaw bone.  When you have dead bone it has to be  removed.  Therefore you might end up having to have surgery to remove part of your jaw bone, the part of the jaw bone that has been affected.   Healing is also a problem if you are to have surgery in the areas where the bone has had radiation therapy.  The same reasons apply.  If you have surgery you need more blood supply which is not available.    When blood supply and oxygen are not available again, there is a chance for the bone to die.  Occasionally ORN happens on its own with no obvious reason.  This is quite rare.  We believe that patients who continue to smoke and/or drink alcohol have a higher chance of having this bone problem.  Therefore once your jaw bone has had radiation therapy if there are any teeth in that area, you should never have them pulled.  You should also never have any surgery on your teeth or gums in that area unless the oral surgeon or Periodontist is aware of your history of radiation. There is some expensive management techniques that might be used to limit your risks.  The risks for ORN either from infection or spontaneous ( or on it's own) are life long.    TRISMUS  Trismus is a condition where the jaw does not allow the mouth to open as wide as it usually does.  This can happen almost suddenly, or in other cases the process is so slow, it is hard to notice it-until it is too far along.  When the jaw joints and/or muscles have been exposed to radiation treatments, the onset of Trismus is very slow.  This is because the muscles are losing their stretching ability over a long period of time, as long as 2 YEARS after the end of radiation.  It is therefore important to exercise these muscles and joints.  TRISMUS EXERCISES   Stack of tongue depressors measuring the same or a little less than the last documented MIO (Maximum Interincisal Opening).  Secure them with a rubber band on both ends.  Place the stack in the patient's mouth, supporting the other end.  Allow 30  seconds for muscle stretching.  Rest for a few seconds.  Repeat 3-5 times  For all radiation patients, this exercise is recommended in the mornings and evenings unless otherwise instructed.  The exercise should be done for a period of 2 YEARS after the end of radiation.  MIO should be checked routinely on recall dental visits by the general dentist or the hospital dentist.  The patient is advised to report any changes, soreness, or difficulties encountered when doing the exercises.  

## 2019-03-17 ENCOUNTER — Other Ambulatory Visit: Payer: Self-pay

## 2019-03-17 ENCOUNTER — Ambulatory Visit
Admission: RE | Admit: 2019-03-17 | Discharge: 2019-03-17 | Disposition: A | Discharge status: Home / Self Care | Payer: Self-pay | Source: Ambulatory Visit | Attending: Otolaryngology | Admitting: Otolaryngology

## 2019-03-17 DIAGNOSIS — H7502 Mastoiditis in infectious and parasitic diseases classified elsewhere, left ear: Secondary | ICD-10-CM | POA: Insufficient documentation

## 2019-03-17 DIAGNOSIS — C801 Malignant (primary) neoplasm, unspecified: Secondary | ICD-10-CM | POA: Insufficient documentation

## 2019-03-17 DIAGNOSIS — R220 Localized swelling, mass and lump, head: Secondary | ICD-10-CM | POA: Insufficient documentation

## 2019-03-17 DIAGNOSIS — C7989 Secondary malignant neoplasm of other specified sites: Secondary | ICD-10-CM | POA: Insufficient documentation

## 2019-03-17 LAB — GLUCOSE, CAPILLARY: Glucose-Capillary: 90 mg/dL (ref 70–99)

## 2019-03-17 MED ORDER — FLUDEOXYGLUCOSE F - 18 (FDG) INJECTION
8.2000 | Freq: Once | INTRAVENOUS | Status: AC | PRN
  Administered 2019-03-17: 8.63 via INTRAVENOUS

## 2019-03-19 ENCOUNTER — Other Ambulatory Visit: Payer: Self-pay | Admitting: Otolaryngology

## 2019-03-19 DIAGNOSIS — J329 Chronic sinusitis, unspecified: Secondary | ICD-10-CM

## 2019-03-20 ENCOUNTER — Ambulatory Visit
Admission: RE | Admit: 2019-03-20 | Discharge: 2019-03-20 | Disposition: A | Discharge status: Home / Self Care | Payer: Self-pay | Source: Ambulatory Visit | Attending: Otolaryngology | Admitting: Otolaryngology

## 2019-03-20 DIAGNOSIS — J329 Chronic sinusitis, unspecified: Secondary | ICD-10-CM

## 2019-03-24 ENCOUNTER — Other Ambulatory Visit: Payer: Self-pay | Admitting: Otolaryngology

## 2019-03-24 DIAGNOSIS — J329 Chronic sinusitis, unspecified: Secondary | ICD-10-CM

## 2019-04-16 ENCOUNTER — Other Ambulatory Visit: Payer: Self-pay | Admitting: Otolaryngology

## 2019-04-17 MED ORDER — CELECOXIB 200 MG PO CAPS
200.00 | ORAL_CAPSULE | ORAL | Status: DC
Start: 2019-04-25 — End: 2019-04-17

## 2019-04-17 MED ORDER — OXYCODONE HCL 5 MG PO TABS
5.00 | ORAL_TABLET | ORAL | Status: DC
Start: ? — End: 2019-04-17

## 2019-04-17 MED ORDER — LACTATED RINGERS IV SOLN
INTRAVENOUS | Status: DC
Start: ? — End: 2019-04-17

## 2019-04-17 MED ORDER — ASPIRIN 325 MG PO TABS
325.00 | ORAL_TABLET | ORAL | Status: DC
Start: 2019-04-26 — End: 2019-04-17

## 2019-04-17 MED ORDER — ACETAMINOPHEN 325 MG PO TABS
650.00 | ORAL_TABLET | ORAL | Status: DC
Start: 2019-04-17 — End: 2019-04-17

## 2019-04-17 MED ORDER — GENERIC EXTERNAL MEDICATION
5.00 | Status: DC
Start: ? — End: 2019-04-17

## 2019-04-17 MED ORDER — ONDANSETRON HCL 4 MG/2ML IJ SOLN
4.00 | INTRAMUSCULAR | Status: DC
Start: ? — End: 2019-04-17

## 2019-04-17 MED ORDER — CVS EAR DROPS OT
40.00 | OTIC | Status: DC
Start: 2019-04-21 — End: 2019-04-17

## 2019-04-17 MED ORDER — PYRIDOXINE-KELP-LECT-VINEGAR 20-33-400-80 MG PO TABS
ORAL_TABLET | ORAL | Status: DC
Start: 2019-04-20 — End: 2019-04-17

## 2019-04-17 MED ORDER — MELATONIN 3 MG PO TABS
6.00 | ORAL_TABLET | ORAL | Status: DC
Start: ? — End: 2019-04-17

## 2019-04-20 MED ORDER — ACETAMINOPHEN 500 MG PO TABS
1000.00 | ORAL_TABLET | ORAL | Status: DC
Start: 2019-04-25 — End: 2019-04-20

## 2019-04-20 MED ORDER — Medication
5.00 | Status: DC
Start: ? — End: 2019-04-20

## 2019-04-20 MED ORDER — Medication
Status: DC
Start: 2019-04-20 — End: 2019-04-20

## 2019-04-20 MED ORDER — SENNOSIDES-DOCUSATE SODIUM 8.6-50 MG PO TABS
1.00 | ORAL_TABLET | ORAL | Status: DC
Start: ? — End: 2019-04-20

## 2019-04-20 MED ORDER — GENERIC EXTERNAL MEDICATION
Status: DC
Start: 2019-04-26 — End: 2019-04-20

## 2019-04-20 MED ORDER — BISACODYL 10 MG RE SUPP
10.00 | RECTAL | Status: DC
Start: ? — End: 2019-04-20

## 2019-04-20 MED ORDER — POLYETHYLENE GLYCOL 3350 17 G PO PACK
17.00 | PACK | ORAL | Status: DC
Start: 2019-04-21 — End: 2019-04-20

## 2019-04-27 MED ORDER — BACITRACIN ZINC 500 UNIT/GM EX OINT
TOPICAL_OINTMENT | CUTANEOUS | Status: DC
Start: 2019-04-25 — End: 2019-04-27

## 2019-04-27 MED ORDER — SENNOSIDES-DOCUSATE SODIUM 8.6-50 MG PO TABS
1.00 | ORAL_TABLET | ORAL | Status: DC
Start: 2019-04-25 — End: 2019-04-27

## 2019-04-27 MED ORDER — GENERIC EXTERNAL MEDICATION
Status: DC
Start: 2019-04-25 — End: 2019-04-27

## 2019-04-27 MED ORDER — MAGNESIUM OXIDE 400 MG PO TABS
400.00 | ORAL_TABLET | ORAL | Status: DC
Start: 2019-04-26 — End: 2019-04-27

## 2019-04-27 MED ORDER — POLYETHYLENE GLYCOL 3350 17 G PO PACK
17.00 | PACK | ORAL | Status: DC
Start: 2019-04-25 — End: 2019-04-27

## 2019-04-27 MED ORDER — ENOXAPARIN SODIUM 40 MG/0.4ML ~~LOC~~ SOLN
40.00 | SUBCUTANEOUS | Status: DC
Start: 2019-04-26 — End: 2019-04-27

## 2019-04-27 MED ORDER — OXYCODONE HCL 5 MG/5ML PO SOLN
5.00 | ORAL | Status: DC
Start: ? — End: 2019-04-27

## 2019-04-30 ENCOUNTER — Other Ambulatory Visit: Payer: Self-pay | Admitting: *Deleted

## 2019-04-30 ENCOUNTER — Telehealth: Payer: Self-pay | Admitting: *Deleted

## 2019-04-30 ENCOUNTER — Ambulatory Visit
Admission: RE | Admit: 2019-04-30 | Discharge: 2019-04-30 | Disposition: A | Discharge status: Home / Self Care | Payer: Self-pay | Source: Ambulatory Visit | Attending: Radiation Oncology | Admitting: Radiation Oncology

## 2019-04-30 DIAGNOSIS — C7989 Secondary malignant neoplasm of other specified sites: Secondary | ICD-10-CM

## 2019-04-30 NOTE — Telephone Encounter (Signed)
Oncology Nurse Navigator Documentation  Fax sent to [facility] Radiology Imaging Library requesting the following imaging be pushed to [employer]:   04/07/2019 MR FACE/SINUSES + SKULL BASE.    Notification of successful fax transmission received.  Order entered for assignment to PACS Timeline.  Gayleen Orem, RN, BSN Head & Neck Oncology Nurse [place] [facility] [place] [phone]

## 2020-09-23 ENCOUNTER — Other Ambulatory Visit: Payer: Self-pay

## 2020-09-23 ENCOUNTER — Emergency Department (HOSPITAL_COMMUNITY)
Admission: EM | Admit: 2020-09-23 | Discharge: 2020-09-23 | Disposition: A | Discharge status: Home / Self Care | Payer: Medicaid Other

## 2020-09-27 ENCOUNTER — Other Ambulatory Visit: Payer: Self-pay

## 2020-09-27 ENCOUNTER — Encounter (HOSPITAL_COMMUNITY): Payer: Self-pay

## 2020-09-27 ENCOUNTER — Emergency Department (HOSPITAL_COMMUNITY)
Admission: EM | Admit: 2020-09-27 | Discharge: 2020-09-27 | Disposition: A | Discharge status: Home / Self Care | Payer: Medicaid Other | Attending: Emergency Medicine | Admitting: Emergency Medicine

## 2020-09-27 DIAGNOSIS — R519 Headache, unspecified: Secondary | ICD-10-CM | POA: Diagnosis not present

## 2020-09-27 DIAGNOSIS — Z5321 Procedure and treatment not carried out due to patient leaving prior to being seen by health care provider: Secondary | ICD-10-CM | POA: Diagnosis not present

## 2020-09-27 DIAGNOSIS — M542 Cervicalgia: Secondary | ICD-10-CM | POA: Diagnosis not present

## 2020-09-27 NOTE — ED Triage Notes (Addendum)
Pt presents with c/o headache and neck pain that started 7 days ago. 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 Pt reports he did call his MD but has been unable to get in touch with anyone that his MD has referred him to.

## 2020-10-05 ENCOUNTER — Emergency Department (HOSPITAL_COMMUNITY)
Admission: EM | Admit: 2020-10-05 | Discharge: 2020-10-05 | Discharge status: Against Medical Advice | Payer: Medicaid Other | Attending: Emergency Medicine | Admitting: Emergency Medicine

## 2020-10-05 ENCOUNTER — Other Ambulatory Visit: Payer: Self-pay

## 2020-10-05 ENCOUNTER — Encounter (HOSPITAL_COMMUNITY): Payer: Self-pay | Admitting: *Deleted

## 2020-10-05 DIAGNOSIS — Z5321 Procedure and treatment not carried out due to patient leaving prior to being seen by health care provider: Secondary | ICD-10-CM | POA: Diagnosis not present

## 2020-10-05 DIAGNOSIS — R519 Headache, unspecified: Secondary | ICD-10-CM | POA: Insufficient documentation

## 2020-10-05 DIAGNOSIS — M542 Cervicalgia: Secondary | ICD-10-CM | POA: Diagnosis not present

## 2020-10-05 NOTE — ED Triage Notes (Signed)
The pt is c/o head and neck pan for 16 days he has been seen at numerus places and he had a mri yesterday

## 2020-10-05 NOTE — ED Notes (Signed)
Pt called for vitals and registration x3 with no response

## 2021-09-07 ENCOUNTER — Ambulatory Visit: Payer: Medicaid Other

## 2021-10-04 ENCOUNTER — Ambulatory Visit: Payer: Medicaid Other | Attending: Gerontology | Admitting: Rehabilitation

## 2021-12-14 ENCOUNTER — Encounter (HOSPITAL_COMMUNITY): Payer: Self-pay | Admitting: Emergency Medicine

## 2021-12-14 ENCOUNTER — Emergency Department (HOSPITAL_COMMUNITY)
Admission: EM | Admit: 2021-12-14 | Discharge: 2021-12-14 | Disposition: A | Discharge status: Home / Self Care | Payer: Medicaid Other | Attending: Emergency Medicine | Admitting: Emergency Medicine

## 2021-12-14 ENCOUNTER — Emergency Department (HOSPITAL_COMMUNITY): Payer: Medicaid Other

## 2021-12-14 ENCOUNTER — Other Ambulatory Visit: Payer: Self-pay

## 2021-12-14 DIAGNOSIS — K9423 Gastrostomy malfunction: Secondary | ICD-10-CM | POA: Insufficient documentation

## 2021-12-14 MED ORDER — DIATRIZOATE MEGLUMINE & SODIUM 66-10 % PO SOLN
30.0000 mL | Freq: Once | ORAL | Status: AC
  Filled 2021-12-14: qty 30

## 2021-12-14 MED ORDER — DIATRIZOATE MEGLUMINE & SODIUM 66-10 % PO SOLN
ORAL | Status: AC
  Administered 2021-12-14: 30 mL
  Filled 2021-12-14: qty 30

## 2021-12-14 NOTE — ED Provider Notes (Signed)
Sign out note  56 y/o male with PEG tube dislodged.  Replaced by Karle Starch. XR ordered. The XR confirms placement. Will dc home.    Lucrezia Starch, MD 12/14/21 [phone]

## 2021-12-14 NOTE — ED Triage Notes (Signed)
Patient arrives POV. Patients feeding tube was caught and pulled out about an hour ago. 24 Fr feeding tube.

## 2021-12-14 NOTE — ED Provider Triage Note (Signed)
Emergency Medicine Provider Triage Evaluation Note  MICHAELJAMES MILNES , a 56 y.o. male  was evaluated in triage.  Pt complains of feeding tube coming out. He states that he has had feeding tube for several years. Tube came out 1 hour ago after getting caught on something. Tube size is 24 [place]. Last changed recently.  Review of Systems  Positive:  Negative:   Physical Exam  BP 126/87 (BP Location: Left Arm)    Pulse 85    Temp (!) 97.5 F (36.4 C)    Resp 18    Ht 5\' 10"  (1.778 m)    Wt 68 kg    SpO2 96%    BMI 21.52 kg/m  Gen:   Awake, no distress   Resp:  Normal effort  MSK:   Moves extremities without difficulty  Other:  PEG stoma. Food content drainage coming from site. Pressure is on site. No erythema or signs of surrounding cellulitis.   Medical Decision Making  Medically screening exam initiated at 12:49 PM.  Appropriate orders placed.  Oather ADAL SERENO was informed that the remainder of the evaluation will be completed by another provider, this initial triage assessment does not replace that evaluation, and the importance of remaining in the ED until their evaluation is complete.  Needs feeding tube replacement.    Adolphus Birchwood, PA-C 12/14/21 1250

## 2021-12-14 NOTE — ED Provider Notes (Signed)
[facility]  Provider Note  CSN: [id number] Arrival date & time: 12/14/21 1243  History Chief Complaint  Patient presents with   feeding tube replacement    Zachary Reeves is a 56 y.o. male with prior mouth cancer now with a PEG tube reports he was standing up from the table today when he got caught and pulled out. He has had a G-tube for about 3 years altogether, this one had been in for about 3 months.    Home Medications Prior to Admission medications   Medication Sig Start Date End Date Taking? Authorizing Provider  azelastine (ASTELIN) 0.1 % nasal spray Place 1 spray into both nostrils 2 (two) times daily as needed for rhinitis. Use in each nostril as directed    [provider]  HYDROcodone-acetaminophen (NORCO) 7.5-325 MG tablet Take 1 tablet by mouth every 6 (six) hours as needed for moderate pain. 03/04/19   Izora Gala, MD  ibuprofen (ADVIL) 200 MG tablet Take 600 mg by mouth every 6 (six) hours as needed for headache or moderate pain.    [provider]  promethazine (PHENERGAN) 25 MG suppository Place 1 suppository (25 mg total) rectally every 6 (six) hours as needed for nausea or vomiting. Patient not taking: Reported on 03/11/2019 03/04/19   Izora Gala, MD  pseudoephedrine-guaifenesin [facility] D) 60-600 MG 12 hr tablet Take 1 tablet by mouth 2 (two) times daily as needed for congestion.    [provider]     Allergies    Penicillins   Review of Systems   Review of Systems Please see HPI for pertinent positives and negatives  Physical Exam BP 126/87 (BP Location: Left Arm)    Pulse 85    Temp (!) 97.5 F (36.4 C)    Resp 18    Ht 5\' 10"  (1.778 m)    Wt 68 kg    SpO2 96%    BMI 21.52 kg/m   Physical Exam Vitals and nursing note reviewed.  HENT:     Head: Normocephalic.     Nose: Nose normal.  Eyes:     Extraocular Movements: Extraocular movements intact.  Pulmonary:     Effort: Pulmonary  effort is normal.  Abdominal:     Comments: Gastrostomy in epigastric area. Patient holding pressure to prevent leakage of gastric contents.   Musculoskeletal:        General: Normal range of motion.     Cervical back: Neck supple.  Skin:    Findings: No rash (on exposed skin).  Neurological:     Mental Status: He is alert and oriented to person, place, and time.  Psychiatric:        Mood and Affect: Mood normal.    ED Results / Procedures / Treatments   EKG None  Procedures FEEDING TUBE REPLACEMENT  Date/Time: 12/14/2021 2:58 PM Performed by: Truddie Hidden, MD Authorized by: Truddie Hidden, MD  Consent: Verbal consent obtained. Indications: tube dislodged Local anesthesia used: no  Anesthesia: Local anesthesia used: no Tube type: gastrostomy Patient position: recumbent Procedure type: replacement Tube size: 24 Fr Endoscope used: no Bulb inflation volume: 7 (ml) Bulb inflation fluid: normal saline Placement/position confirmation: x-ray Comments: Patient offered a new G-tube but prefers replacement of his dislodged Mic-Key tube as it is lower profile and has the connectors compatible with his home tube feeding setup.      Medications Ordered in the ED Medications  diatrizoate meglumine-sodium (GASTROGRAFIN) 66-10 % solution (  has no administration in time range)    Initial Impression and Plan Patient with G-tube accidentally pulled out. His is 24Fr, will place a new one when a replacement can be found.   ED Course   Clinical Course as of 12/14/21 1518  Thu Dec 14, 2021  1517 Care of the patient signed out to Dr. Roslynn Amble at the change of shift pending confirmatory xray.  [CS]    Clinical Course User Index [CS] Truddie Hidden, MD     MDM Rules/Calculators/A&P Medical Decision Making Problems Addressed: Gastrostomy malfunction [facility]): acute illness or injury  Amount and/or Complexity of Data Reviewed Radiology: ordered.    Final Clinical  Impression(s) / ED Diagnoses Final diagnoses:  Gastrostomy malfunction [facility])    Rx / DC Orders ED Discharge Orders     None        Truddie Hidden, MD 12/14/21 [phone]

## 2022-03-01 ENCOUNTER — Other Ambulatory Visit: Payer: Self-pay

## 2022-03-01 ENCOUNTER — Emergency Department (HOSPITAL_COMMUNITY): Payer: Medicaid Other

## 2022-03-01 ENCOUNTER — Encounter (HOSPITAL_COMMUNITY): Payer: Self-pay | Admitting: Emergency Medicine

## 2022-03-01 ENCOUNTER — Inpatient Hospital Stay (HOSPITAL_COMMUNITY)
Admission: EM | Admit: 2022-03-01 | Discharge: 2022-03-05 | DRG: 200 | Disposition: A | Discharge status: Home / Self Care | Payer: Medicaid Other | Attending: General Surgery | Admitting: General Surgery

## 2022-03-01 DIAGNOSIS — Z9221 Personal history of antineoplastic chemotherapy: Secondary | ICD-10-CM

## 2022-03-01 DIAGNOSIS — S270XXA Traumatic pneumothorax, initial encounter: Principal | ICD-10-CM | POA: Diagnosis present

## 2022-03-01 DIAGNOSIS — Z981 Arthrodesis status: Secondary | ICD-10-CM

## 2022-03-01 DIAGNOSIS — Z923 Personal history of irradiation: Secondary | ICD-10-CM | POA: Diagnosis not present

## 2022-03-01 DIAGNOSIS — Z880 Allergy status to penicillin: Secondary | ICD-10-CM | POA: Diagnosis not present

## 2022-03-01 DIAGNOSIS — G8929 Other chronic pain: Secondary | ICD-10-CM | POA: Diagnosis present

## 2022-03-01 DIAGNOSIS — Z85818 Personal history of malignant neoplasm of other sites of lip, oral cavity, and pharynx: Secondary | ICD-10-CM

## 2022-03-01 DIAGNOSIS — S12600A Unspecified displaced fracture of seventh cervical vertebra, initial encounter for closed fracture: Secondary | ICD-10-CM | POA: Diagnosis present

## 2022-03-01 DIAGNOSIS — Z825 Family history of asthma and other chronic lower respiratory diseases: Secondary | ICD-10-CM | POA: Diagnosis not present

## 2022-03-01 DIAGNOSIS — W28XXXA Contact with powered lawn mower, initial encounter: Secondary | ICD-10-CM

## 2022-03-01 DIAGNOSIS — Z87891 Personal history of nicotine dependence: Secondary | ICD-10-CM | POA: Diagnosis not present

## 2022-03-01 DIAGNOSIS — S2243XA Multiple fractures of ribs, bilateral, initial encounter for closed fracture: Secondary | ICD-10-CM | POA: Diagnosis present

## 2022-03-01 DIAGNOSIS — Y929 Unspecified place or not applicable: Secondary | ICD-10-CM

## 2022-03-01 DIAGNOSIS — Z85828 Personal history of other malignant neoplasm of skin: Secondary | ICD-10-CM

## 2022-03-01 LAB — LACTIC ACID, PLASMA: Lactic Acid, Venous: 2.1 mmol/L (ref 0.5–1.9)

## 2022-03-01 LAB — CBC
HCT: 49.2 % (ref 39.0–52.0)
Hemoglobin: 16 g/dL (ref 13.0–17.0)
MCH: 29.8 pg (ref 26.0–34.0)
MCHC: 32.5 g/dL (ref 30.0–36.0)
MCV: 91.6 fL (ref 80.0–100.0)
Platelets: 163 10*3/uL (ref 150–400)
RBC: 5.37 MIL/uL (ref 4.22–5.81)
RDW: 13.9 % (ref 11.5–15.5)
WBC: 8.2 10*3/uL (ref 4.0–10.5)
nRBC: 0 % (ref 0.0–0.2)

## 2022-03-01 LAB — TROPONIN I (HIGH SENSITIVITY)
Troponin I (High Sensitivity): 4 ng/L (ref <18)
Troponin I (High Sensitivity): 4 ng/L (ref <18)

## 2022-03-01 LAB — I-STAT CHEM 8, ED
BUN: 21 mg/dL — ABNORMAL HIGH (ref 6–20)
Calcium, Ion: 1.12 mmol/L — ABNORMAL LOW (ref 1.15–1.40)
Chloride: 104 mmol/L (ref 98–111)
Creatinine, Ser: 0.6 mg/dL — ABNORMAL LOW (ref 0.61–1.24)
Glucose, Bld: 92 mg/dL (ref 70–99)
HCT: 47 % (ref 39.0–52.0)
Hemoglobin: 16 g/dL (ref 13.0–17.0)
Potassium: 4 mmol/L (ref 3.5–5.1)
Sodium: 138 mmol/L (ref 135–145)
TCO2: 25 mmol/L (ref 22–32)

## 2022-03-01 LAB — BASIC METABOLIC PANEL
Anion gap: 9 (ref 5–15)
BUN: 20 mg/dL (ref 6–20)
CO2: 26 mmol/L (ref 22–32)
Calcium: 9.4 mg/dL (ref 8.9–10.3)
Chloride: 103 mmol/L (ref 98–111)
Creatinine, Ser: 0.69 mg/dL (ref 0.61–1.24)
GFR, Estimated: >60 mL/min (ref >60)
Glucose, Bld: 96 mg/dL (ref 70–99)
Potassium: 4.7 mmol/L (ref 3.5–5.1)
Sodium: 138 mmol/L (ref 135–145)

## 2022-03-01 LAB — HEPATIC FUNCTION PANEL
ALT: 37 U/L (ref 0–44)
AST: 41 U/L (ref 15–41)
Albumin: 4.1 g/dL (ref 3.5–5.0)
Alkaline Phosphatase: 97 U/L (ref 38–126)
Bilirubin, Direct: 0.2 mg/dL (ref 0.0–0.2)
Indirect Bilirubin: 0.5 mg/dL (ref 0.3–0.9)
Total Bilirubin: 0.7 mg/dL (ref 0.3–1.2)
Total Protein: 7.9 g/dL (ref 6.5–8.1)

## 2022-03-01 LAB — PROTIME-INR
INR: 1 (ref 0.8–1.2)
Prothrombin Time: 13.1 seconds (ref 11.4–15.2)

## 2022-03-01 LAB — SAMPLE TO BLOOD BANK

## 2022-03-01 LAB — HIV ANTIBODY (ROUTINE TESTING W REFLEX): HIV Screen 4th Generation wRfx: NONREACTIVE

## 2022-03-01 MED ORDER — IOHEXOL 300 MG/ML  SOLN
100.0000 mL | Freq: Once | INTRAMUSCULAR | Status: AC | PRN
  Administered 2022-03-01: 100 mL via INTRAVENOUS

## 2022-03-01 MED ORDER — OXYCODONE HCL 5 MG/5ML PO SOLN
30.0000 mg | Freq: Four times a day (QID) | ORAL | Status: DC
  Filled 2022-03-01: qty 30

## 2022-03-01 MED ORDER — LIDOCAINE 5 % EX PTCH
2.0000 | MEDICATED_PATCH | CUTANEOUS | Status: DC
  Administered 2022-03-01 – 2022-03-05 (×5): 2 via TRANSDERMAL
  Filled 2022-03-01 (×6): qty 2

## 2022-03-01 MED ORDER — BACLOFEN 1 MG/ML ORAL SUSPENSION: 20.0000 mg | Freq: Three times a day (TID) | ORAL | Status: DC

## 2022-03-01 MED ORDER — OXYCODONE HCL 5 MG/5ML PO SOLN: 15.0000 mg | ORAL | Status: DC | PRN

## 2022-03-01 MED ORDER — OXYCODONE HCL 5 MG PO TABS
15.0000 mg | ORAL_TABLET | ORAL | Status: DC | PRN
  Administered 2022-03-01 – 2022-03-05 (×11): 15 mg
  Filled 2022-03-01 (×11): qty 3

## 2022-03-01 MED ORDER — BACLOFEN 10 MG PO TABS
20.0000 mg | ORAL_TABLET | Freq: Three times a day (TID) | ORAL | Status: DC
  Administered 2022-03-01 – 2022-03-05 (×12): 20 mg
  Filled 2022-03-01: qty 1
  Filled 2022-03-01 (×12): qty 2

## 2022-03-01 MED ORDER — ONDANSETRON 4 MG PO TBDP: 4.0000 mg | ORAL_TABLET | Freq: Four times a day (QID) | ORAL | Status: DC | PRN

## 2022-03-01 MED ORDER — METOPROLOL TARTRATE 5 MG/5ML IV SOLN: 5.0000 mg | Freq: Four times a day (QID) | INTRAVENOUS | Status: DC | PRN

## 2022-03-01 MED ORDER — SODIUM CHLORIDE 0.9 % IV SOLN: Freq: Once | INTRAVENOUS | Status: AC

## 2022-03-01 MED ORDER — PANTOPRAZOLE SODIUM 40 MG PO TBEC
40.0000 mg | DELAYED_RELEASE_TABLET | Freq: Every day | ORAL | Status: DC
  Administered 2022-03-05: 40 mg via ORAL
  Filled 2022-03-01 (×2): qty 1

## 2022-03-01 MED ORDER — HYDROMORPHONE HCL 1 MG/ML IJ SOLN
1.0000 mg | Freq: Once | INTRAMUSCULAR | Status: AC
Start: 2022-03-01 — End: 2022-03-01
  Administered 2022-03-01: 1 mg via INTRAVENOUS
  Filled 2022-03-01: qty 1

## 2022-03-01 MED ORDER — PANTOPRAZOLE SODIUM 40 MG IV SOLR
40.0000 mg | Freq: Every day | INTRAVENOUS | Status: DC
  Administered 2022-03-01 – 2022-03-04 (×4): 40 mg via INTRAVENOUS
  Filled 2022-03-01 (×4): qty 10

## 2022-03-01 MED ORDER — KETOROLAC TROMETHAMINE 30 MG/ML IJ SOLN
30.0000 mg | Freq: Three times a day (TID) | INTRAMUSCULAR | Status: DC
Start: 2022-03-01 — End: 2022-03-05
  Administered 2022-03-01 – 2022-03-05 (×13): 30 mg via INTRAVENOUS
  Filled 2022-03-01 (×14): qty 1

## 2022-03-01 MED ORDER — HYDROMORPHONE HCL 1 MG/ML IJ SOLN
2.0000 mg | INTRAMUSCULAR | Status: DC | PRN
  Administered 2022-03-01 – 2022-03-03 (×7): 2 mg via INTRAVENOUS
  Filled 2022-03-01 (×8): qty 2

## 2022-03-01 MED ORDER — SODIUM CHLORIDE 0.9 % IV SOLN: INTRAVENOUS | Status: DC

## 2022-03-01 MED ORDER — ONDANSETRON HCL 4 MG/2ML IJ SOLN: 4.0000 mg | Freq: Four times a day (QID) | INTRAMUSCULAR | Status: DC | PRN

## 2022-03-01 MED ORDER — ENOXAPARIN SODIUM 30 MG/0.3ML IJ SOSY
30.0000 mg | PREFILLED_SYRINGE | Freq: Two times a day (BID) | INTRAMUSCULAR | Status: DC
  Administered 2022-03-02 – 2022-03-05 (×7): 30 mg via SUBCUTANEOUS
  Filled 2022-03-01 (×7): qty 0.3

## 2022-03-01 MED ORDER — HYDROMORPHONE HCL 1 MG/ML IJ SOLN
1.0000 mg | Freq: Once | INTRAMUSCULAR | Status: AC
  Administered 2022-03-01: 1 mg via INTRAVENOUS
  Filled 2022-03-01: qty 1

## 2022-03-01 MED ORDER — MORPHINE SULFATE (PF) 4 MG/ML IV SOLN
4.0000 mg | Freq: Once | INTRAVENOUS | Status: AC
  Administered 2022-03-01: 4 mg via INTRAVENOUS
  Filled 2022-03-01: qty 1

## 2022-03-01 MED ORDER — ARTIFICIAL TEARS OPHTHALMIC OINT
TOPICAL_OINTMENT | OPHTHALMIC | Status: DC | PRN
  Filled 2022-03-01: qty 3.5

## 2022-03-01 NOTE — ED Triage Notes (Signed)
Pt BIB [facility] with reports of flipping his lawn mower on top of him. Pt reports left sided chest pain. C collar in place. Pt received 140mg fentanyl en route.  ?

## 2022-03-01 NOTE — Progress Notes (Signed)
Orthopedic Tech Progress Note ?Patient Details:  ?Zachary Reeves ?[birth year] ?[id number] ? ?Level 2 trauma, ortho not needed at this time ? ?Patient ID: [name], male   DOB: 05-12-1966, 56 y.o.   MRN: [id number] ? ?Xaniyah Buchholz Jeri Modena ?03/01/2022, 5:31 PM ? ?

## 2022-03-01 NOTE — ED Notes (Signed)
Trauma Response Nurse Documentation ? ? ?Noel JOBIE POPP is a 56 y.o. male arriving to [facility] via [facility]. Activated after arrival and PCXR.  ? ?Trauma was activated as a Level 2 by Dr. Fulton Reek based on the following trauma criteria Discretion of Emergency Department Physician. Patient cleared for CT by Dr. Dina Rich. Patient to CT with team. GCS 15. ? ?History  ? Past Medical History:  ?Diagnosis Date  ? Drug abuse ([place])   ? History of kidney stones   ? passed stones  ? Tobacco abuse   ?  ? Past Surgical History:  ?Procedure Laterality Date  ? DIRECT LARYNGOSCOPY Right 03/04/2019  ? Procedure: DIRECT LARYNGOSCOPY;  Surgeon: Izora Gala, MD;  Location: [facility];  Service: ENT;  Laterality: Right;  ? EXCISION MASS NECK Right 03/04/2019  ? Procedure: Excision Mass Neck;  Surgeon: Izora Gala, MD;  Location: [facility];  Service: ENT;  Laterality: Right;  ? EYE SURGERY    ? left eye as a child  ? MASS BIOPSY Right 03/04/2019  ? Procedure: Neck Mass Biopsy with Frozen section ;  Surgeon: Izora Gala, MD;  Location: [facility];  Service: ENT;  Laterality: Right;  ? TONSILLECTOMY Right 03/04/2019  ? Procedure: Right Tonsillectomy, Frozen Biospy of Tonsil;  Surgeon: Izora Gala, MD;  Location: [facility];  Service: ENT;  Laterality: Right;  ? WISDOM TOOTH EXTRACTION    ?  ? ? ? ?Initial Focused Assessment (If applicable, or please see trauma documentation): ?On TRN arrival to room, pt was sitting up, tripodding in bed, with grunting resp with pain, splinting the left side of chest. O2 sats were 88% on room air- TRN placed O2 on at 3L/M/[place] -- without success due to pt's anatomy post facial surgery. O2 placed by venturi mask at 3L/M by RT--  ?Pt has an extensive history with care given primarily at [facility].  ? ?2nd IV started 18G  ? ? ? ? ?Lezlie Octave Joyce Leckey  ?Trauma Response RN ? ?Please call TRN at [phone] for further assistance. ?  ?

## 2022-03-01 NOTE — ED Notes (Signed)
Replaced EMS C-collar with [place] J collar.  ?

## 2022-03-01 NOTE — ED Notes (Signed)
Pt placed on simple mask at this time.

## 2022-03-01 NOTE — ED Notes (Signed)
Pt transported to CT with with this RN and Santiago Glad, [employer].  ?

## 2022-03-01 NOTE — Progress Notes (Signed)
Pt arrived to unit via ED. Aox4 and vitals WNL. Focused assessment completed. Patient belongings included tubing for mickey G-tube and feedings.  ? ?Bed in lowest position, call light in reach and bed alarm on.  ? ? ?

## 2022-03-01 NOTE — H&P (Signed)
? ? ? ?Zachary Reeves ?[birth year]  ?[id number].   ? ?Requesting MD: Dr. Blanchie Dessert ?Chief Complaint/Reason for Consult: Lawnmower accident  ? ?HPI: Zachary Reeves is a 56 y.o. male who presented as a non-level trauma after a lawn mower flipped ontop of him. Patient complains of right rib pain and sob. No other complaints. Workup shows multiple bilateral rib fx and moderate R PTX. We were asked to see for admission. Remainder of his workup reassuring.  ? 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 He follows with ENT (Dr. Patwa/Dr. Nicolette Bang) and Oncology (Dr. Melven Sartorius) as an outpatient for this . He is in a pain mgt program and takes XTampza 36 mg PO TID, Oxycodone IR 15 mg PO q4h PRN, and baclofen 10 mg PO TID PRN daily.  ? ?Apparently supposed to have surgery to reconstruct tear ducts next month for a tear duct injury after surgery that causes his eyes to watery constantly.  ? ?Divorced. Lives with male partner he has been with for ~10 years. Works in [employer]. He is a former smoker, smoking 2ppd since the age of 65 and recently quit (~45pack year hx). He also endorses daily MJ use and weekend alcohol use. ? ?ROS: ?Review of Systems  ?Constitutional:  Negative for chills and fever.  ?Respiratory:  Positive for shortness of breath. Negative for wheezing.   ?     Bilateral rib pain  ?Cardiovascular:  Negative for chest pain and palpitations.  ?Gastrointestinal:  Negative for abdominal pain, nausea and vomiting.  ?Musculoskeletal:  Positive for back pain.  ?All other systems reviewed and are negative. ?As above.  ? ?Family  History  ?Problem Relation Age of Onset  ? COPD Mother   ? Cancer Father   ? ? ?Past Medical History:  ?Diagnosis Date  ? Drug abuse ([place])   ? History of kidney stones   ? passed stones  ? Tobacco abuse   ? ? ?Past Surgical History:  ?Procedure Laterality Date  ? DIRECT LARYNGOSCOPY Right 03/04/2019  ? Procedure: DIRECT LARYNGOSCOPY;  Surgeon: Izora Gala, MD;  Location: [facility];  Service: ENT;  Laterality: Right;  ? EXCISION MASS NECK Right 03/04/2019  ? Procedure: Excision Mass Neck;  Surgeon: Izora Gala, MD;  Location: [facility];  Service: ENT;  Laterality: Right;  ? EYE SURGERY    ? left eye as a child  ? MASS BIOPSY Right 03/04/2019  ? Procedure: Neck Mass Biopsy with Frozen section ;  Surgeon: Izora Gala, MD;  Location: [facility];  Service: ENT;  Laterality: Right;  ? TONSILLECTOMY Right 03/04/2019  ? Procedure: Right Tonsillectomy, Frozen Biospy of Tonsil;  Surgeon: Izora Gala, MD;  Location: [facility];  Service: ENT;  Laterality: Right;  ? WISDOM TOOTH EXTRACTION    ? ? ?Social History:  reports that he has been smoking. He has been smoking an average of 2 packs per day. He has never used smokeless tobacco. He reports current alcohol use of about 14.0 standard drinks per week. He reports current drug use. Drugs: Cocaine and Marijuana. ? ?Allergies:  ?Allergies  ?Allergen Reactions  ? Penicillins Other (See Comments)  ?  UNSPECIFIED CHILDHOOD REACTION  ?  Did it involve swelling of the face/tongue/throat, SOB, or low BP? Unknown ?Did it involve sudden or severe rash/hives, skin peeling, or any reaction on the inside of your mouth or nose? Unknown ?Did you need to seek medical attention at a hospital or doctor's office? Unknown ?When did it last happen?      childhood allergy ?If all above answers are "NO", may proceed with cephalosporin use. ?  ? ? ?(Not in a hospital admission) ? ? ? ?Physical Exam: ?Blood pressure 119/87, pulse 73, temperature 97.9 ?F (36.6 ?C), temperature source Oral, resp. rate (!) 21, SpO2 93  %. ?General: pleasant, WD, cachectic male who is sitting on ED stretcher and appears uncomfortable ?HEENT: Abrasion to left forehead, surgical changes of nose. Sclera are noninjected.  PERRL.  Mouth is pink and moist with surgical changes ?Heart: regular, rate, and rhythm.  Normal s1,s2. No obvious murmurs, gallops, or rubs noted.  Palpable radial and pedal pulses bilaterally ?Lungs: CTAB, no wheezes, rhonchi, or rales noted.  Respiratory effort nonlabored ?Abd: soft, NT, ND, +BS, Mickey gastrostomy tube present  ?MS: all 4 extremities are symmetrical with no cyanosis, clubbing, or edema. ?Skin: warm and dry with no masses, lesions, or rashes ?Neuro: Cranial nerves 2-12 grossly intact, sensation is normal throughout ?Psych: A&Ox3 with an appropriate affect. ? ? ?Results for orders placed or performed during the hospital encounter of 03/01/22 (from the past 48 hour(s))  ?Protime-INR     Status: None  ? Collection Time: 03/01/22  2:54 PM  ?Result Value Ref Range  ? Prothrombin Time 13.1 11.4 - 15.2 seconds  ? INR 1.0 0.8 - 1.2  ?  Comment: (NOTE) ?INR goal varies based on device and disease states. ?Performed at [facility], [place] [address], [place], [place] ?[address] ?  ?Sample to Blood Bank     Status: None  ? Collection Time: 03/01/22  3:07 PM  ?Result Value Ref Range  ? Blood Bank Specimen SAMPLE AVAILABLE FOR TESTING   ? Sample Expiration    ?  03/02/2022,2359 ?Performed at [facility], [place] [address], [place], [place] [address] ?  ?CBC     Status: None  ? Collection Time: 03/01/22  3:30 PM  ?Result Value Ref Range  ? WBC 8.2 4.0 - 10.5 K/uL  ? RBC 5.37 4.22 - 5.81 MIL/uL  ? Hemoglobin 16.0 13.0 - 17.0 g/dL  ? HCT 49.2 39.0 - 52.0 %  ? MCV 91.6 80.0 - 100.0 fL  ? MCH 29.8 26.0 - 34.0 pg  ? MCHC 32.5 30.0 - 36.0 g/dL  ? RDW 13.9 11.5 - 15.5 %  ? Platelets 163 150 - 400 K/uL  ? nRBC 0.0 0.0 - 0.2 %  ?  Comment: Performed at [facility], [place] [address], [place], [place] [address]  ?I-Stat Chem  8, ED     Status: Abnormal  ? Collection Time: 03/01/22  3:30 PM  ?Result Value Ref Range  ? Sodium 138 135 - 145 mmol/L  ? Potassium 4.0 3.5 - 5.1 mmol/L  ? Chloride 104 98 - 111 mmol/L  ? BUN 21 (H) 6 - 20 mg/dL  ? Creatinine, Ser 0.60 (L) 0.61 - 1.24 mg/dL  ? Glucose, Bld 92 70 - 99 mg/dL  ?  Comment: Glucose reference range applies only to samples taken after fasting for at least 8 hours.  ? Calcium, Ion 1.12 (L) 1.15 - 1.40 mmol/L  ? TCO2 25 22 - 32 mmol/L  ?  Hemoglobin 16.0 13.0 - 17.0 g/dL  ? HCT 47.0 39.0 - 52.0 %  ? ?DG Pelvis Portable ? ?Result Date: 03/01/2022 ?CLINICAL DATA:  Trauma, lawnmower flipped on top of him. EXAM: PORTABLE PELVIS 1-2 VIEWS COMPARISON:  None Available. FINDINGS: The cortical margins of the bony pelvis are intact. No fracture. Pubic symphysis and sacroiliac joints are congruent. Both femoral heads are well-seated in the respective acetabula. IMPRESSION: No visualized pelvic fracture. Electronically Signed   By: Keith Rake M.D.   On: 03/01/2022 15:36  ? ?DG Chest Portable 1 View ? ?Result Date: 03/01/2022 ?CLINICAL DATA:  Chest pain, lawnmower accident EXAM: PORTABLE CHEST 1 VIEW COMPARISON:  10/27/2018 FINDINGS: The heart size and mediastinal contours are within normal limits. Small right apical pneumothorax, approximately 15% volume. Emphysema. The visualized skeletal structures are unremarkable. IMPRESSION: 1. Small right apical pneumothorax, approximately 15% volume. No displaced rib fracture appreciated. 2. Emphysema. These results will be called to the ordering clinician or representative by the Radiologist Assistant, and communication documented in the PACS or [employer]. Electronically Signed   By: Delanna Ahmadi M.D.   On: 03/01/2022 14:28   ? ?Anti-infectives (From admission, onward)  ? ? None  ? ?  ? ? ?Assessment/Plan ?Lawnmower accident  ?Multiple bilateral rib fractures with moderate R PTX -  CXR AM. Supplemental oxygen. Aggressive Pulm toilet. Multimodal pain  control. PT. ?Hx of SCC of nasal cavity s/p surgery/reconstruction, chemo and radiation - Reported to be in remission. Follows with ENT (Dr. Patwa/Dr. Nicolette Bang) and Oncology (Dr. Melven Sartorius) as an outpatient

## 2022-03-01 NOTE — ED Notes (Signed)
Date and time results received: 03/01/22 4:52 PM ? ?(use smartphrase ".now" to insert current time) ? ?Test: Lactic acid ?Critical Value: 2.1 ? ?Name of Provider Notified: Dr. Maryan Rued ? ?Orders Received? Or Actions Taken?: see chart ?

## 2022-03-01 NOTE — ED Provider Notes (Signed)
Assumed care from Dr. Dina Rich at 3:30 PM.  Patient in a lawnmower accident where the mower rolled over him.  Complaining of significant chest pain and shortness of breath.  Plain films which she already evaluated showed pneumothorax.  I have independently visualized and interpreted patient's CTs and patient has a pneumothorax and rib fractures present.  No bleeding noted in the abdomen.  Radiology reports moderate sized right-sided pneumothorax at approximately 25% and bilateral rib fractures but no other acute injuries.  Patient requires recurrent doses of pain medication.  The trauma team was contacted and is planning on admitting the patient but at this time does not want to proceed with chest tube.  They are using a flutter valve and oxygen.  I independently interpreted patient's labs today which are all normal except for his lactic acid of 2.1. ?  ?Blanchie Dessert, MD ?03/01/22 1656 ? ?

## 2022-03-02 ENCOUNTER — Inpatient Hospital Stay (HOSPITAL_COMMUNITY): Payer: Medicaid Other

## 2022-03-02 LAB — COMPREHENSIVE METABOLIC PANEL
ALT: 31 U/L (ref 0–44)
AST: 42 U/L — ABNORMAL HIGH (ref 15–41)
Albumin: 3.2 g/dL — ABNORMAL LOW (ref 3.5–5.0)
Alkaline Phosphatase: 75 U/L (ref 38–126)
Anion gap: 7 (ref 5–15)
BUN: 17 mg/dL (ref 6–20)
CO2: 24 mmol/L (ref 22–32)
Calcium: 8.5 mg/dL — ABNORMAL LOW (ref 8.9–10.3)
Chloride: 104 mmol/L (ref 98–111)
Creatinine, Ser: 0.65 mg/dL (ref 0.61–1.24)
GFR, Estimated: >60 mL/min (ref >60)
Glucose, Bld: 97 mg/dL (ref 70–99)
Potassium: 3.8 mmol/L (ref 3.5–5.1)
Sodium: 135 mmol/L (ref 135–145)
Total Bilirubin: 0.9 mg/dL (ref 0.3–1.2)
Total Protein: 6.2 g/dL — ABNORMAL LOW (ref 6.5–8.1)

## 2022-03-02 LAB — CBC
HCT: 40 % (ref 39.0–52.0)
Hemoglobin: 13.9 g/dL (ref 13.0–17.0)
MCH: 30.8 pg (ref 26.0–34.0)
MCHC: 34.8 g/dL (ref 30.0–36.0)
MCV: 88.7 fL (ref 80.0–100.0)
Platelets: 123 10*3/uL — ABNORMAL LOW (ref 150–400)
RBC: 4.51 MIL/uL (ref 4.22–5.81)
RDW: 14 % (ref 11.5–15.5)
WBC: 6 10*3/uL (ref 4.0–10.5)
nRBC: 0 % (ref 0.0–0.2)

## 2022-03-02 MED ORDER — OXYCODONE HCL 5 MG PO TABS
30.0000 mg | ORAL_TABLET | Freq: Four times a day (QID) | ORAL | Status: DC
  Administered 2022-03-02 – 2022-03-05 (×15): 30 mg
  Filled 2022-03-02 (×16): qty 6

## 2022-03-02 MED ORDER — NICOTINE 7 MG/24HR TD PT24
7.0000 mg | MEDICATED_PATCH | Freq: Every day | TRANSDERMAL | Status: DC
  Administered 2022-03-02 – 2022-03-05 (×3): 7 mg via TRANSDERMAL
  Filled 2022-03-02 (×6): qty 1

## 2022-03-02 NOTE — Progress Notes (Signed)
?  Transition of Care (TOC) Screening Note ? ? ?Patient Details  ?Name: Zachary Reeves ?Date of Birth: [birth year] ? ? ?Transition of Care [facility]) CM/SW Contact:    ?Ella Bodo, RN ?Phone Number: ?03/02/2022, 4:59 PM ? ? ? ?Transition of Care Department [facility]) has reviewed patient and no TOC needs have been identified at this time. We will continue to monitor patient advancement through interdisciplinary progression rounds. If new patient transition needs arise, please place a TOC consult. ? ?Reinaldo Raddle, RN, BSN  ?Trauma/Neuro ICU Case Manager ?[phone] ? ?

## 2022-03-02 NOTE — Progress Notes (Signed)
In regards to patients C7 fx - I consulted NSGY and spoke with Margo Aye, who recommended flex-ex films and if negative C-Collar can be removed. Films have been obtained and reviewed by their team. They report C-Collar can be removed. I have notified RN.  ?

## 2022-03-02 NOTE — Progress Notes (Signed)
Orthopedic Tech Progress Note ?Patient Details:  ?Zachary Reeves ?1966/06/19 ?[id number] ? ?Ortho Devices ?Type of Ortho Device: Hard collar ?Ortho Device/Splint Interventions: Application ?  ?Post Interventions ?Patient Tolerated: Well ?Instructions Provided: Care of device ? ?[name] ?03/02/2022, 3:15 PM ? ?

## 2022-03-02 NOTE — Progress Notes (Signed)
OT Cancellation Note ? ?Patient Details ?Name: Zachary Reeves ?MRN: [id number] ?DOB: 08-29-1966 ? ? ?Cancelled Treatment:    Reason Eval/Treat Not Completed: Patient at procedure or test/ unavailable (xray leaving the unit) ? ?Jeri Modena ?03/02/2022, 12:31 PM ?

## 2022-03-02 NOTE — Progress Notes (Addendum)
Subjective: CC: AFVSS. On RA.  Tolerating home TF's and tolerating with n/v.  Pain over ribs. No sob. No other complaints. No new neck pain. No n/t/w of extremities. Used PRN oxy x 2 and IV Dilaudid x 4 between admission yesterday afternoon/evening to this morning. Pain 5/10 and feels overall well controlled w/ adjustments.  Voiding.  Mobilizing in room without difficulty. PT session pending. MAE's.  Wants to go home. Lives at home with his wife. 2 steps into home.  Discussed w/ wife at bedside.   Objective: Vital signs in last 24 hours: Temp:  [97.6 F (36.4 C)-98.6 F (37 C)] 97.6 F (36.4 C) (05/12 0714) Pulse Rate:  [68-86] 71 (05/12 0714) Resp:  [12-21] 12 (05/12 0714) BP: (107-128)/(75-87) 108/75 (05/12 0714) SpO2:  [90 %-97 %] 90 % (05/12 0714) FiO2 (%):  [28 %] 28 % (05/11 1536) Last BM Date :  (pta)  Intake/Output from previous day: 05/11 0701 - 05/12 0700 In: 470.6 [I.V.:470.6] Out: 350 [Urine:350] Intake/Output this shift: No intake/output data recorded.  PE: Gen:  Alert, NAD, pleasant Neck: No c-spine ttp Card:  RRR Pulm:  CTAB, no W/R/R, effort normal Abd: Soft, ND, NT, +BS, g-tube in place Neuro. Good strength to BUE and BLE's and equal. SILT to BUE and BLE's.  Ext:  MAE's without difficulty. No LE edema Psych: A&Ox3   Lab Results:  Recent Labs    03/01/22 1729 03/02/22 0457  WBC 12.3* 6.0  HGB 15.2 13.9  HCT 46.5 40.0  PLT 149* 123*   BMET Recent Labs    03/01/22 1530 03/01/22 1729 03/02/22 0457  NA 138  138  --  135  K 4.7  4.0  --  3.8  CL 103  104  --  104  CO2 26  --  24  GLUCOSE 96  92  --  97  BUN 20  21*  --  17  CREATININE 0.69  0.60* 0.71 0.65  CALCIUM 9.4  --  8.5*   PT/INR Recent Labs    03/01/22 1454  LABPROT 13.1  INR 1.0   CMP     Component Value Date/Time   NA 135 03/02/2022 0457   K 3.8 03/02/2022 0457   CL 104 03/02/2022 0457   CO2 24 03/02/2022 0457   GLUCOSE 97 03/02/2022 0457   BUN 17  03/02/2022 0457   CREATININE 0.65 03/02/2022 0457   CALCIUM 8.5 (L) 03/02/2022 0457   PROT 6.2 (L) 03/02/2022 0457   ALBUMIN 3.2 (L) 03/02/2022 0457   AST 42 (H) 03/02/2022 0457   ALT 31 03/02/2022 0457   ALKPHOS 75 03/02/2022 0457   BILITOT 0.9 03/02/2022 0457   GFRNONAA >60 03/02/2022 0457   GFRAA >60 03/02/2019 0827   Lipase     Component Value Date/Time   LIPASE 12 09/08/2009 0736    Studies/Results: DG Chest 2 View  Result Date: 03/02/2022 CLINICAL DATA:  Post surgery EXAM: CHEST - 2 VIEW COMPARISON:  CT chest abdomen pelvis 03/01/2022 FINDINGS: Normal heart size, mediastinal contours, and pulmonary vascularity. Emphysematous and bronchitic changes consistent with COPD. Small RIGHT pneumothorax partially loculated at RIGHT base. Small pleural effusion and subsegmental atelectasis RIGHT base. LEFT lung clear. Bones demineralized. IMPRESSION: Mild RIGHT hydropneumothorax without mediastinal shift, pneumothorax partially loculated at RIGHT base. Overall size pneumothorax not significantly changed from prior CT though the small RIGHT pleural effusion has increased. Underlying COPD. Electronically Signed   By: Lavonia Dana M.D.   On:  03/02/2022 08:15   CT HEAD WO CONTRAST  Addendum Date: 03/02/2022   ADDENDUM REPORT: 03/02/2022 08:42 ADDENDUM: Upon re-review of study, the following finding is noted: There is a nondisplaced fracture through the anterior osteophyte at the superior endplate of C7 where it meets the vertebral body (for example series 8, image 46). The margins are not corticated suggesting acuity. Updated findings communicated to Dr. Gilford Raid by telephone on 03/02/2022 at 8:40 a.m. Electronically Signed   By: Macy Mis M.D.   On: 03/02/2022 08:42   Result Date: 03/02/2022 CLINICAL DATA:  Poly trauma. EXAM: CT HEAD WITHOUT CONTRAST CT CERVICAL SPINE WITHOUT CONTRAST TECHNIQUE: Multidetector CT imaging of the head and cervical spine was performed following the standard  protocol without intravenous contrast. Multiplanar CT image reconstructions of the cervical spine were also generated. RADIATION DOSE REDUCTION: This exam was performed according to the departmental dose-optimization program which includes automated exposure control, adjustment of the mA and/or kV according to patient size and/or use of iterative reconstruction technique. COMPARISON:  Head CT from 2016 FINDINGS: CT HEAD FINDINGS Brain: No evidence of acute infarction, hemorrhage, hydrocephalus, extra-axial collection or mass lesion/mass effect. Vascular: No vascular calcifications or aneurysm hyperdense vessels. Skull: No skull fracture. 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 Evidence of extensive sinonasal surgery. Scattered right mastoid effusions are noted. The globes are intact. Other: No scalp lesion or scalp hematoma. CT CERVICAL SPINE FINDINGS Alignment: Normal overall alignment. Skull base and vertebrae: Upper cervical fusion hardware posteriorly fusing C1-C3. No complicating features. No acute cervical spine fracture is identified. Soft tissues and spinal canal: No prevertebral fluid or swelling. No visible canal hematoma. Disc levels: No large disc protrusions, significant spinal or foraminal stenosis. Upper chest: Emphysematous changes and right-sided pneumothorax. Other: Upper bilateral rib fractures. IMPRESSION: 1. No acute intracranial findings or skull fracture. 2. Extensive surgical changes from sinonasal surgery related to the patient's cancer. 3. Normal overall alignment of the cervical spine without acute fracture. 4. Upper cervical fusion hardware without complicating features. 5. Right-sided pneumothorax. 6. Bilateral upper rib fractures. Electronically Signed: By: Marijo Sanes M.D. On: 03/01/2022 16:55   CT CERVICAL SPINE WO CONTRAST  Addendum Date: 03/02/2022    ADDENDUM REPORT: 03/02/2022 08:42 ADDENDUM: Upon re-review of study, the following finding is noted: There is a nondisplaced fracture through the anterior osteophyte at the superior endplate of C7 where it meets the vertebral body (for example series 8, image 46). The margins are not corticated suggesting acuity. Updated findings communicated to Dr. Gilford Raid by telephone on 03/02/2022 at 8:40 a.m. Electronically Signed   By: Macy Mis M.D.   On: 03/02/2022 08:42   Result Date: 03/02/2022 CLINICAL DATA:  Poly trauma. EXAM: CT HEAD WITHOUT CONTRAST CT CERVICAL SPINE WITHOUT CONTRAST TECHNIQUE: Multidetector CT imaging of the head and cervical spine was performed following the standard protocol without intravenous contrast. Multiplanar CT image reconstructions of the cervical spine were also generated. RADIATION DOSE REDUCTION: This exam was performed according to the departmental dose-optimization program which includes automated exposure control, adjustment of the mA and/or kV according to patient size and/or use of iterative reconstruction technique. COMPARISON:  Head CT from 2016 FINDINGS: CT HEAD FINDINGS Brain: No evidence of acute infarction, hemorrhage, hydrocephalus, extra-axial collection or mass lesion/mass effect. Vascular: No vascular calcifications or aneurysm hyperdense vessels. Skull: No skull fracture. Sinuses/Orbits: Evidence of extensive remote facial surgery possibly related to the patient's oropharyngeal  carcinoma.2 fluid in the right maxillary sinus is noted. Evidence of extensive sinonasal surgery. Scattered right mastoid effusions are noted. The globes are intact. Other: No scalp lesion or scalp hematoma. CT CERVICAL SPINE FINDINGS Alignment: Normal overall alignment. Skull base and vertebrae: Upper cervical fusion hardware posteriorly fusing C1-C3. No complicating features. No acute cervical spine fracture is identified. Soft tissues and spinal canal: No prevertebral fluid or swelling.  No visible canal hematoma. Disc levels: No large disc protrusions, significant spinal or foraminal stenosis. Upper chest: Emphysematous changes and right-sided pneumothorax. Other: Upper bilateral rib fractures. IMPRESSION: 1. No acute intracranial findings or skull fracture. 2. Extensive surgical changes from sinonasal surgery related to the patient's cancer. 3. Normal overall alignment of the cervical spine without acute fracture. 4. Upper cervical fusion hardware without complicating features. 5. Right-sided pneumothorax. 6. Bilateral upper rib fractures. Electronically Signed: By: Marijo Sanes M.D. On: 03/01/2022 16:55   DG Pelvis Portable  Result Date: 03/01/2022 CLINICAL DATA:  Trauma, lawnmower flipped on top of him. EXAM: PORTABLE PELVIS 1-2 VIEWS COMPARISON:  None Available. FINDINGS: The cortical margins of the bony pelvis are intact. No fracture. Pubic symphysis and sacroiliac joints are congruent. Both femoral heads are well-seated in the respective acetabula. IMPRESSION: No visualized pelvic fracture. Electronically Signed   By: Keith Rake M.D.   On: 03/01/2022 15:36   CT CHEST ABDOMEN PELVIS W CONTRAST  Result Date: 03/01/2022 CLINICAL DATA:  A lawnmower rolled over.  Blunt trauma. EXAM: CT CHEST, ABDOMEN, AND PELVIS WITH CONTRAST TECHNIQUE: Multidetector CT imaging of the chest, abdomen and pelvis was performed following the standard protocol during bolus administration of intravenous contrast. RADIATION DOSE REDUCTION: This exam was performed according to the departmental dose-optimization program which includes automated exposure control, adjustment of the mA and/or kV according to patient size and/or use of iterative reconstruction technique. CONTRAST:  17m OMNIPAQUE IOHEXOL 300 MG/ML  SOLN COMPARISON:  PET-CT 03/17/2019 FINDINGS: CT CHEST FINDINGS Cardiovascular: The heart is normal in size. No pericardial effusion. The aorta is normal in caliber. No dissection. Mediastinum/Nodes:  No mediastinal or hilar mass or adenopathy or hematoma. The esophagus is grossly normal. Lungs/Pleura: There is a moderate-sized right-sided pneumothorax estimated at 25%. Severe underlying bullous emphysema is noted. Right lower lobe atelectasis but no definite pulmonary contusions or pulmonary lesions. Musculoskeletal: Right-sided rib fractures are noted. Posterior second rib fracture is minimally displaced. Third posterolateral rib fracture is displaced and surrounded by hematoma and may have caused the pneumothorax. Nondisplaced posterior rib fractures involving the fourth, fifth and sixth ribs. Displaced seventh posterior rib fracture. Displaced left third lateral rib fracture. Nondisplaced fourth and fifth lateral rib fractures. No sternal or thoracic vertebral body fractures. Mild wedging of the L1 vertebral body is a chronic finding. CT ABDOMEN PELVIS FINDINGS Hepatobiliary: No acute hepatic injury. No perihepatic fluid collections. No hepatic lesions or intrahepatic biliary dilatation. The gallbladder is unremarkable. No common bile duct dilatation. Pancreas: No mass, inflammation or ductal dilatation. No acute pancreatic injury or peripancreatic fluid collection. Spleen: The spleen is intact. No perisplenic hematoma. Small accessory spleen is noted. Adrenals/Urinary Tract: The adrenal glands are unremarkable. There is a 17 mm low-attenuation lesion involving the midpole region of the right kidney posteriorly. This measures 22 Hounsfield units and is likely a benign cyst. The bladder is unremarkable. Stomach/Bowel: The stomach contains a feeding gastrostomy tube. The duodenum, small bowel colon are grossly normal. Vascular/Lymphatic: The aorta and branch vessels are patent. Moderate age advanced atherosclerotic calcifications. The major venous structures  are patent. No mesenteric or retroperitoneal mass, adenopathy or hematoma. Reproductive: The prostate gland and seminal vesicles are unremarkable. Other:  No free air or free fluid. Small amount of free air is noted in the lower chest wall but this is likely due to the patient's pneumothorax. Musculoskeletal: The bony structures are intact. Both hips are normally located. The pubic symphysis and SI joints are intact. No pelvic or lumbar spine fractures. IMPRESSION: 1. Moderate-sized right-sided pneumothorax estimated at 25%. 2. Multiple bilateral rib fractures as discussed above. The right third and seventh rib fractures are displaced and the left third rib fracture is displaced. 3. Severe underlying bullous emphysema and pulmonary scarring. 4. No acute abdominal/pelvic findings are identified. 5. Intact spine and bony pelvis. Aortic Atherosclerosis (ICD10-I70.0) and Emphysema (ICD10-J43.9). Electronically Signed   By: Marijo Sanes M.D.   On: 03/01/2022 16:46   DG Chest Portable 1 View  Result Date: 03/01/2022 CLINICAL DATA:  Chest pain, lawnmower accident EXAM: PORTABLE CHEST 1 VIEW COMPARISON:  10/27/2018 FINDINGS: The heart size and mediastinal contours are within normal limits. Small right apical pneumothorax, approximately 15% volume. Emphysema. The visualized skeletal structures are unremarkable. IMPRESSION: 1. Small right apical pneumothorax, approximately 15% volume. No displaced rib fracture appreciated. 2. Emphysema. These results will be called to the ordering clinician or representative by the Radiologist Assistant, and communication documented in the PACS or [employer]. Electronically Signed   By: Delanna Ahmadi M.D.   On: 03/01/2022 14:28    Anti-infectives: Anti-infectives (From admission, onward)    None        Assessment/Plan Lawnmower accident  Multiple bilateral rib fractures with R PTX -  R 2-7 rib fx's. L 3-5th rib fx's. CXR this AM w/ mild right hydroptx. Will review with attending. Cont supplemental oxygen. Aggressive Pulm toilet. Multimodal pain control. PT. C7 Fx - noted Rads addendum at 842am 5/12 there is a  nondisplaced fracture through the anterior osteophyte at the superior endplate of C7 where it meets the vertebral body. Will place c-collar and consult NGSY. I have discussed with RN to ask if we can get C-Collar applied now.  Hx of SCC of nasal cavity s/p surgery/reconstruction, chemo and radiation - Reported to be in remission. Follows with ENT (Dr. Patwa/Dr. Nicolette Bang) and Oncology (Dr. 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  - My colleague discussed with pharmacy on admission. 30 mg oxy IR q6h should be equivalent to 36 mg Xtampza TID. Continued home 15 mg oxycodone IR q4h prn. Cont Baclofen (increased from home dose), lidocaine patches, and IV toradol/IV dilaudid for pain control   Addendum: Discussed with NSGY, Margo Aye, who recommended flex-ex films and if negative C-Collar can be removed.    FEN - TF per patient's home regimen, NS '@50'$  cc/h VTE - LMWH ID - tdap updated at recent PCP visit Foley - none    Dispo - Review CXR w/ MD. C-Collar. NSGY consult. Pain control. PT.   LOS: 1 day    Jillyn Ledger , [facility] Surgery 03/02/2022, 8:58 AM Please see Amion for pager number during day hours 7:00am-4:30pm

## 2022-03-02 NOTE — TOC CAGE-AID Note (Signed)
Transition of Care (TOC) - CAGE-AID Screening ? ? ?Patient Details  ?Name: Zachary Reeves ?MRN: [id number] ?Date of Birth: 07-May-1966 ? ?Transition of Care (TOC) CM/SW Contact:    ?Emberlie Gotcher C Tarpley-Carter, LCSWA ?Phone Number: ?03/02/2022, 1:45 PM ? ? ?Clinical Narrative: ?Pt participated in [place].  Pt stated he does use substance and ETOH.  Pt was offered resources, due to usage of substance and ETOH.    ? ?Passenger transport manager, MSW, LCSW-A ?Pronouns:  She/Her/Hers ?[facility] ?Clinical Social Worker ?Direct Number:  [phone] ?[email or web address]  ? ?CAGE-AID Screening: ?  ? ?Have You Ever Felt You Ought to Cut Down on Your Drinking or Drug Use?: Yes ?Have People Annoyed You By Critizing Your Drinking Or Drug Use?: No ?Have You Felt Bad Or Guilty About Your Drinking Or Drug Use?: No ?Have You Ever Had a Drink or Used Drugs First Thing In The Morning to Steady Your Nerves or to Get Rid of a Hangover?: No ?CAGE-AID Score: 1 ? ?Substance Abuse Education Offered: Yes ? ?Substance abuse interventions: Educational Materials ? ? ? ? ? ? ?

## 2022-03-02 NOTE — Evaluation (Addendum)
Physical Therapy Evaluation ?Patient Details ?Name: Zachary Reeves ?MRN: [id number] ?DOB: 07/06/1966 ?Today's Date: 03/02/2022 ? ?History of Present Illness ? Brittany LIONELL MATUSZAK is a 56 y.o. male who presented as a non-level trauma after a lawn mower flipped ontop of him with multiple bilateral rib fx and moderate R PTX.  Also found nondisplaced C7 anterior superior endplate osteophyte fx.   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 Patient presents with mobility limited due to pain in chest/ribs and back.  He was able to sit up from bed with HOB up with S and stood with S for lines.  Ambulated without device with S to minguard.  SpO2 dropped while on RA, but improved with standing rest and cues for deep breathing.  Significant other in the room and able to assist at d/c.  Feel he should progress with continued skilled PT in the acute setting and will likely not need follow up PT at d/c.  Did not have cervical collar this am, but mobilizes with stiff neck from prior surgery.  RN aware.    ?   ? ?Recommendations for follow up therapy are one component of a multi-disciplinary discharge planning process, led by the attending physician.  Recommendations may be updated based on patient status, additional functional criteria and insurance authorization. ? ?Follow Up Recommendations No PT follow up ? ?  ?Assistance Recommended at Discharge PRN  ?Patient can return home with the following ? A little help with bathing/dressing/bathroom;Assistance with cooking/housework;Assist for transportation ? ?  ?Equipment Recommendations None recommended by PT  ?Recommendations for Other Services ?    ?  ?Functional Status Assessment Patient has had a  recent decline in their functional status and demonstrates the ability to make significant improvements in function in a reasonable and predictable amount of time.  ? ?  ?Precautions / Restrictions Precautions ?Precautions: Cervical ?Required Braces or Orthoses: Cervical Brace ?Cervical Brace:  (ordered, not in room)  ? ?  ? ?Mobility ? Bed Mobility ?Overal bed mobility: Modified Independent ?  ?  ?  ?  ?  ?  ?General bed mobility comments: HOB elevated, cues for slowing to allow time for line management ?  ? ?Transfers ?Overall transfer level: Needs assistance ?  ?Transfers: Sit to/from Stand ?Sit to Stand: Supervision ?  ?  ?  ?  ?  ?General transfer comment: for safety with lines ?  ? ?Ambulation/Gait ?Ambulation/Gait assistance: Supervision, Min guard ?Gait Distance (Feet): 150 Feet ?Assistive device: None ?Gait Pattern/deviations: Step-through pattern, WFL(Within Functional Limits) ?  ?  ?  ?General Gait Details: in hallway without device, no LOB, stops some due to soreness in rib and cues for breathing due to SpO2 drop to 85% on RA, back up to 90% standing rest and cues ? ?Stairs ?  ?  ?  ?  ?  ? ?Wheelchair Mobility ?  ? ?Modified Rankin (Stroke Patients Only) ?  ? ?  ? ?Balance Overall balance assessment: Needs assistance ?  ?Sitting balance-Leahy Scale: Good ?  ?  ?Standing balance support: No upper extremity supported ?Standing balance-Leahy Scale: Good ?Standing balance comment: mild instability due to pain and previous cervical fusion ?  ?  ?  ?  ?  ?  ?  ?  ?  ?  ?  ?   ? ? ? ?  Pertinent Vitals/Pain Pain Assessment ?Pain Assessment: 0-10 ?Pain Score: 7  ?Pain Location: neck, back and ribs/chest ?Pain Descriptors / Indicators: Aching, Guarding, Grimacing ?Pain Intervention(s): Repositioned, Monitored during session, Patient requesting pain meds-RN notified  ? ? ?Home Living Family/patient expects to be discharged to:: Private residence ?Living Arrangements: Spouse/significant other ?Available Help at  Discharge: Family ?Type of Home: House ?Home Access: Stairs to enter ?Entrance Stairs-Rails: Right ?Entrance Stairs-Number of Steps: 2 ?  ?Home Layout: One level ?Home Equipment: None ?   ?  ?Prior Function Prior Level of Function : Independent/Modified Independent ?  ?  ?  ?  ?  ?  ?  ?  ?  ? ? ?Hand Dominance  ? Dominant Hand: Right ? ?  ?Extremity/Trunk Assessment  ? Upper Extremity Assessment ?Upper Extremity Assessment: RUE deficits/detail;LUE deficits/detail ?RUE Deficits / Details: moves grossly WFL, but pain in chest with shoulder elevation and R worse than L per S.O. had tetnus shot in R arm) ?LUE Deficits / Details: moves grossly WFL, some pain in chest with shoulder elevation ?  ? ?Lower Extremity Assessment ?Lower Extremity Assessment: LLE deficits/detail ?LLE Deficits / Details: mildly painful with resisted ankle DF with h/o graft from anterior aspect of shin for oropharyngeal reconstruction ?  ? ?Cervical / Trunk Assessment ?Cervical / Trunk Assessment: Other exceptions ?Cervical / Trunk Exceptions: stiff in neck with h/o cervical fusion and radiation necrosis of occiput; stiff through ribs R >L with pain  ?Communication  ? Communication: Expressive difficulties (due to h/o oropharyngeal CA and reconstruction)  ?Cognition Arousal/Alertness: Awake/alert ?Behavior During Therapy: [facility] for tasks assessed/performed ?Overall Cognitive Status: Within Functional Limits for tasks assessed ?  ?  ?  ?  ?  ?  ?  ?  ?  ?  ?  ?  ?  ?  ?  ?  ?  ?  ?  ? ?  ?General Comments General comments (skin integrity, edema, etc.): significant other in the room and supportive,  pt on Venturi mask at 28% at rest, Ambulated on RA as noted SpO2 down to 85%, but back up to 90% with standing rest and cues for breathing; replaced O2 at rest ? ?  ?Exercises    ? ?Assessment/Plan  ?  ?PT Assessment Patient needs continued PT services  ?PT Problem List Decreased activity tolerance;Cardiopulmonary status limiting activity;Pain;Decreased  safety awareness;Decreased mobility ? ?   ?  ?PT Treatment Interventions Therapeutic activities;Patient/family education;Gait training;Functional mobility training;Stair training   ? ?PT Goals (Current goals can be found in the Care Plan section)  ?Acute Rehab PT Goals ?Patient Stated Goal: to go home ?PT Goal Formulation: With patient/family ?Time For Goal Achievement: 03/16/22 ?Potential to Achieve Goals: Good ? ?  ?Frequency Min 4X/week ?  ? ? ?Co-evaluation   ?  ?  ?  ?  ? ? ?  ?AM-PAC PT "6 Clicks" Mobility  ?Outcome Measure Help needed turning from your back to your side while in a flat bed without using bedrails?: A Little ?Help needed moving from lying on your back to sitting on the side of a flat bed without using bedrails?: A Little ?Help needed moving to and from a bed to a chair (including a wheelchair)?: A Little ?Help needed standing up from a chair using your arms (e.g., wheelchair or bedside chair)?: A Little ?Help needed to walk in hospital room?: A Little ?Help needed climbing 3-5 steps with a railing? : Total ?6 Click Score: 16 ? ?  ?End of Session  Equipment Utilized During Treatment: Gait belt ?Activity Tolerance: Patient tolerated treatment well ?Patient left: in bed;with call bell/phone within reach;with family/visitor present ?Nurse Communication: Patient requests pain meds ?PT Visit Diagnosis: Other abnormalities of gait and mobility (R26.89);Pain ?Pain - part of body:  (neck, back, chest) ?  ? ?Time: 4327-6147 ?PT Time Calculation (min) (ACUTE ONLY): 24 min ? ? ?Charges:   PT Evaluation ?$PT Eval Low Complexity: 1 Low ?PT Treatments ?$Gait Training: 8-22 mins ?  ?   ? ? ?Magda Kiel, PT ?Acute Rehabilitation Services ?WLKHV:[phone] ?Office:[phone] ?03/02/2022 ? ? ?[name] ?03/02/2022, 1:33 PM ? ?

## 2022-03-02 NOTE — Progress Notes (Signed)
OT Cancellation Note ? ?Patient Details ?Name: Zachary Reeves ?MRN: [id number] ?DOB: [birth year] ? ? ?Cancelled Treatment:    Reason Eval/Treat Not Completed: Other (comment) (holding OT evaluation until [facility] arrives to room due to new findings for C7 fx) ? ?Jeri Modena ?03/02/2022, 2:11 PM ? ?Brynn, OTR/L  ?Acute Rehabilitation Services ?Office: [phone] ?. ? ?

## 2022-03-03 ENCOUNTER — Inpatient Hospital Stay (HOSPITAL_COMMUNITY): Payer: Medicaid Other

## 2022-03-03 MED ORDER — GABAPENTIN 300 MG PO CAPS
300.0000 mg | ORAL_CAPSULE | Freq: Three times a day (TID) | ORAL | Status: DC
  Administered 2022-03-03 – 2022-03-05 (×8): 300 mg via ORAL
  Filled 2022-03-03 (×8): qty 1

## 2022-03-03 MED ORDER — METHOCARBAMOL 1000 MG/10ML IJ SOLN
500.0000 mg | Freq: Four times a day (QID) | INTRAVENOUS | Status: DC | PRN
  Filled 2022-03-03: qty 5

## 2022-03-03 MED ORDER — GUAIFENESIN 100 MG/5ML PO LIQD
15.0000 mL | ORAL | Status: DC | PRN
  Administered 2022-03-03 – 2022-03-04 (×3): 15 mL
  Filled 2022-03-03 (×4): qty 20

## 2022-03-03 MED ORDER — LIP MEDEX EX OINT
TOPICAL_OINTMENT | CUTANEOUS | Status: DC | PRN
  Filled 2022-03-03: qty 7

## 2022-03-03 MED ORDER — HYDROMORPHONE HCL 1 MG/ML IJ SOLN
2.0000 mg | INTRAMUSCULAR | Status: DC | PRN
  Administered 2022-03-03 – 2022-03-04 (×7): 2 mg via INTRAVENOUS
  Filled 2022-03-03 (×9): qty 2

## 2022-03-03 MED ORDER — ACETAMINOPHEN 500 MG PO TABS
1000.0000 mg | ORAL_TABLET | Freq: Four times a day (QID) | ORAL | Status: DC
Start: 2022-03-03 — End: 2022-03-05
  Administered 2022-03-03 – 2022-03-05 (×9): 1000 mg via ORAL
  Filled 2022-03-03 (×9): qty 2

## 2022-03-03 NOTE — Progress Notes (Signed)
? ? ?   ?Subjective: ?CC: ?C/o chest wall and mid back pain, not well controlled this morning ? ?Objective: ?Vital signs in last 24 hours: ?Temp:  [97.7 ?F (36.5 ?C)-98.3 ?F (36.8 ?C)] 98 ?F (36.7 ?C) (05/13 2778) ?Pulse Rate:  [66-82] 74 (05/13 0743) ?Resp:  [13-25] 18 (05/13 0743) ?BP: (88-129)/(69-87) 88/77 (05/13 2423) ?SpO2:  [87 %-94 %] 94 % (05/13 0743) ?Last BM Date :  (pta) ? ?Intake/Output from previous day: ?05/12 0701 - 05/13 0700 ?In: 500 [P.O.:500] ?Out: 100 [Urine:100] ?Intake/Output this shift: ?Total I/O ?In: 100 [I.V.:100] ?Out: -  ? ?PE: ?Gen:  Alert, NAD, pleasant ?Neck: No c-spine ttp ?Card:  RRR ?Pulm:  CTAB, no W/R/R, effort normal ?Abd: Soft, ND, NT, +BS, g-tube in place ?Neuro. Good strength to BUE and BLE's and equal. SILT to BUE and BLE's.  ?Ext:  MAE's without difficulty. No LE edema ?Psych: A&Ox3  ? ?Lab Results:  ?Recent Labs  ?  03/01/22 ?1729 03/02/22 ?5361  ?WBC 12.3* 6.0  ?HGB 15.2 13.9  ?HCT 46.5 40.0  ?PLT 149* 123*  ? ? ?BMET ?Recent Labs  ?  03/01/22 ?1530 03/01/22 ?1729 03/02/22 ?0457  ?NA 138  138  --  135  ?K 4.7  4.0  --  3.8  ?CL 103  104  --  104  ?CO2 26  --  24  ?GLUCOSE 96  92  --  97  ?BUN 20  21*  --  17  ?CREATININE 0.69  0.60* 0.71 0.65  ?CALCIUM 9.4  --  8.5*  ? ? ?PT/INR ?Recent Labs  ?  03/01/22 ?1454  ?LABPROT 13.1  ?INR 1.0  ? ? ?CMP  ?   ?Component Value Date/Time  ? NA 135 03/02/2022 0457  ? K 3.8 03/02/2022 0457  ? CL 104 03/02/2022 0457  ? CO2 24 03/02/2022 0457  ? GLUCOSE 97 03/02/2022 0457  ? BUN 17 03/02/2022 0457  ? CREATININE 0.65 03/02/2022 0457  ? CALCIUM 8.5 (L) 03/02/2022 0457  ? PROT 6.2 (L) 03/02/2022 0457  ? ALBUMIN 3.2 (L) 03/02/2022 0457  ? AST 42 (H) 03/02/2022 0457  ? ALT 31 03/02/2022 0457  ? ALKPHOS 75 03/02/2022 0457  ? BILITOT 0.9 03/02/2022 0457  ? GFRNONAA >60 03/02/2022 0457  ? GFRAA >60 03/02/2019 0827  ? ?Lipase  ?   ?Component Value Date/Time  ? LIPASE 12 09/08/2009 0736  ? ? ?Studies/Results: ?DG Chest 2 View ? ?Result  Date: 03/02/2022 ?CLINICAL DATA:  Post surgery EXAM: CHEST - 2 VIEW COMPARISON:  CT chest abdomen pelvis 03/01/2022 FINDINGS: Normal heart size, mediastinal contours, and pulmonary vascularity. Emphysematous and bronchitic changes consistent with COPD. Small RIGHT pneumothorax partially loculated at RIGHT base. Small pleural effusion and subsegmental atelectasis RIGHT base. LEFT lung clear. Bones demineralized. IMPRESSION: Mild RIGHT hydropneumothorax without mediastinal shift, pneumothorax partially loculated at RIGHT base. Overall size pneumothorax not significantly changed from prior CT though the small RIGHT pleural effusion has increased. Underlying COPD. Electronically Signed   By: Lavonia Dana M.D.   On: 03/02/2022 08:15  ? ?CT HEAD WO CONTRAST ? ?Addendum Date: 03/02/2022   ?ADDENDUM REPORT: 03/02/2022 08:42 ADDENDUM: Upon re-review of study, the following finding is noted: There is a nondisplaced fracture through the anterior osteophyte at the superior endplate of C7 where it meets the vertebral body (for example series 8, image 46). The margins are not corticated suggesting acuity. Updated findings communicated to Dr. Gilford Raid by telephone on 03/02/2022 at 8:40 a.m. Electronically Signed  By: Macy Mis M.D.   On: 03/02/2022 08:42  ? ?Result Date: 03/02/2022 ?CLINICAL DATA:  Poly trauma. EXAM: CT HEAD WITHOUT CONTRAST CT CERVICAL SPINE WITHOUT CONTRAST TECHNIQUE: Multidetector CT imaging of the head and cervical spine was performed following the standard protocol without intravenous contrast. Multiplanar CT image reconstructions of the cervical spine were also generated. RADIATION DOSE REDUCTION: This exam was performed according to the departmental dose-optimization program which includes automated exposure control, adjustment of the mA and/or kV according to patient size and/or use of iterative reconstruction technique. COMPARISON:  Head CT from 2016 FINDINGS: CT HEAD FINDINGS Brain: No evidence of  acute infarction, hemorrhage, hydrocephalus, extra-axial collection or mass lesion/mass effect. Vascular: No vascular calcifications or aneurysm hyperdense vessels. Skull: No skull fracture. 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 Evidence of extensive sinonasal surgery. Scattered right mastoid effusions are noted. The globes are intact. Other: No scalp lesion or scalp hematoma. CT CERVICAL SPINE FINDINGS Alignment: Normal overall alignment. Skull base and vertebrae: Upper cervical fusion hardware posteriorly fusing C1-C3. No complicating features. No acute cervical spine fracture is identified. Soft tissues and spinal canal: No prevertebral fluid or swelling. No visible canal hematoma. Disc levels: No large disc protrusions, significant spinal or foraminal stenosis. Upper chest: Emphysematous changes and right-sided pneumothorax. Other: Upper bilateral rib fractures. IMPRESSION: 1. No acute intracranial findings or skull fracture. 2. Extensive surgical changes from sinonasal surgery related to the patient's cancer. 3. Normal overall alignment of the cervical spine without acute fracture. 4. Upper cervical fusion hardware without complicating features. 5. Right-sided pneumothorax. 6. Bilateral upper rib fractures. Electronically Signed: By: Marijo Sanes M.D. On: 03/01/2022 16:55  ? ?CT CERVICAL SPINE WO CONTRAST ? ?Addendum Date: 03/02/2022   ?ADDENDUM REPORT: 03/02/2022 08:42 ADDENDUM: Upon re-review of study, the following finding is noted: There is a nondisplaced fracture through the anterior osteophyte at the superior endplate of C7 where it meets the vertebral body (for example series 8, image 46). The margins are not corticated suggesting acuity. Updated findings communicated to Dr. Gilford Raid by telephone on 03/02/2022 at 8:40 a.m. Electronically Signed   By: Macy Mis M.D.   On:  03/02/2022 08:42  ? ?Result Date: 03/02/2022 ?CLINICAL DATA:  Poly trauma. EXAM: CT HEAD WITHOUT CONTRAST CT CERVICAL SPINE WITHOUT CONTRAST TECHNIQUE: Multidetector CT imaging of the head and cervical spine was performed following the standard protocol without intravenous contrast. Multiplanar CT image reconstructions of the cervical spine were also generated. RADIATION DOSE REDUCTION: This exam was performed according to the departmental dose-optimization program which includes automated exposure control, adjustment of the mA and/or kV according to patient size and/or use of iterative reconstruction technique. COMPARISON:  Head CT from 2016 FINDINGS: CT HEAD FINDINGS Brain: No evidence of acute infarction, hemorrhage, hydrocephalus, extra-axial collection or mass lesion/mass effect. Vascular: No vascular calcifications or aneurysm hyperdense vessels. Skull: No skull fracture. 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 Evidence of extensive sinonasal surgery. Scattered right mastoid effusions are noted. The globes are intact. Other: No scalp lesion or scalp hematoma. CT CERVICAL SPINE FINDINGS Alignment: Normal overall alignment. Skull base and vertebrae: Upper cervical fusion hardware posteriorly fusing C1-C3. No complicating features. No acute cervical spine fracture is identified. Soft tissues and spinal canal: No prevertebral fluid or swelling. No visible canal hematoma. Disc levels: No large disc  protrusions, significant spinal or foraminal stenosis. Upper chest: Emphysematous changes and right-sided pneumothorax. Other: Upper bilateral rib fractures. IMPRESSION: 1. No acute intracranial findings or skull fracture. 2. Extensive surgical changes from sinonasal surgery related to the patient's cancer. 3. Normal overall alignment of the cervical spine without acute fracture. 4. Upper cervical  fusion hardware without complicating features. 5. Right-sided pneumothorax. 6. Bilateral upper rib fractures. Electronically Signed: By: Marijo Sanes M.D. On: 03/01/2022 16:55  ? ?DG Pelvis Portable ? ?Result Date: 5/

## 2022-03-03 NOTE — Progress Notes (Signed)
Physical Therapy Treatment ?Patient Details ?Name: Zachary Reeves ?MRN: [id number] ?DOB: April 14, 1966 ?Today's Date: 03/03/2022 ? ? ?History of Present Illness Zachary Reeves Nephew is a 56 y.o. male who presented as a non-level trauma after a lawn mower flipped ontop of him with multiple bilateral rib fx and moderate R PTX.  Also found nondisplaced C7 anterior superior endplate osteophyte fx.   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 ?  ?PT Comments  ? ? Slower pace and much more limited by pain, though pt eager to keep doing it if it helps him, declined the urinal stating he needs to walk to the bathroom to help himself continue to get better.  Discussed appropriate soreness in the ribs now that he is trying to be more mobile.  PT will continue to follow.  Feel he should progress and be able to d/c home when stable.    ?Recommendations for follow up therapy are one component of a multi-disciplinary discharge planning process, led by the attending physician.  Recommendations may be updated based on patient status, additional functional criteria and insurance authorization. ? ?Follow Up Recommendations ? No PT follow up ?  ?  ?Assistance Recommended at Discharge PRN  ?Patient can return home with the following A little help with bathing/dressing/bathroom;Assistance with cooking/housework;Assist for transportation ?  ?Equipment Recommendations ? None recommended by PT  ?  ?Recommendations for Other Services   ? ? ?  ?Precautions / Restrictions Precautions ?Precautions: Fall  ?  ? ?Mobility ? Bed Mobility ?Overal bed mobility: Modified Independent ?  ?  ?  ?  ?  ?  ?General bed mobility comments: increased time, obviously painful, HOB up, discussed sleeping in  recliner at home ?  ? ?Transfers ?Overall transfer level: Needs assistance ?  ?Transfers: Sit to/from Stand ?Sit to Stand: Min guard ?  ?  ?  ?  ?  ?General transfer comment: assist for safety and due to pain ?  ? ?Ambulation/Gait ?Ambulation/Gait assistance: Min guard ?Gait Distance (Feet): 150 Feet ?Assistive device: None ?Gait Pattern/deviations: Step-to pattern, Step-through pattern, Decreased stride length, Shuffle ?  ?  ?  ?General Gait Details: slow due to pain and stops to rest with cues throughout for breathing ? ? ?Stairs ?  ?  ?  ?  ?  ? ? ?Wheelchair Mobility ?  ? ?Modified Rankin (Stroke Patients Only) ?  ? ? ?  ?Balance Overall balance assessment: Needs assistance ?  ?Sitting balance-Leahy Scale: Good ?  ?  ?Standing balance support: Single extremity supported ?Standing balance-Leahy Scale: Fair ?Standing balance comment: limited by pain, kept hand on the IV pole throughout ?  ?  ?  ?  ?  ?  ?  ?  ?  ?  ?  ?  ? ?  ?Cognition Arousal/Alertness: Awake/alert ?Behavior During Therapy: [facility] for tasks assessed/performed ?Overall Cognitive Status: Within Functional Limits for tasks assessed ?  ?  ?  ?  ?  ?  ?  ?  ?  ?  ?  ?  ?  ?  ?  ?  ?  ?  ?  ? ?  ?Exercises   ? ?  ?General Comments General comments (skin integrity, edema, etc.): significant other initially in the room, RN delivered IV meds  after ambulation; SpO2 88-91% on RA throughout ?  ?  ? ?Pertinent Vitals/Pain Pain Assessment ?Pain Score: 9  ?Pain Location: back/ribs ?Pain Descriptors / Indicators: Aching, Guarding, Grimacing ?Pain Intervention(s): Monitored during session, Repositioned, Patient requesting pain meds-RN notified  ? ? ?Home Living   ?  ?  ?  ?  ?  ?  ?  ?  ?  ?   ?  ?Prior Function    ?  ?  ?   ? ?PT Goals (current goals can now be found in the care plan section) Progress towards PT goals: Progressing toward goals ? ?  ?Frequency ? ? ? Min 4X/week ? ? ? ?  ?PT Plan Current plan remains appropriate  ? ? ?Co-evaluation   ?  ?  ?  ?  ? ?   ?AM-PAC PT "6 Clicks" Mobility   ?Outcome Measure ? Help needed turning from your back to your side while in a flat bed without using bedrails?: A Little ?Help needed moving from lying on your back to sitting on the side of a flat bed without using bedrails?: A Little ?Help needed moving to and from a bed to a chair (including a wheelchair)?: A Little ?Help needed standing up from a chair using your arms (e.g., wheelchair or bedside chair)?: A Little ?Help needed to walk in hospital room?: A Little ?Help needed climbing 3-5 steps with a railing? : Total ?6 Click Score: 16 ? ?  ?End of Session Equipment Utilized During Treatment: Gait belt ?Activity Tolerance: Patient tolerated treatment well ?Patient left: in bed;with call bell/phone within reach;with family/visitor present ?Nurse Communication: Patient requests pain meds ?PT Visit Diagnosis: Other abnormalities of gait and mobility (R26.89);Pain ?Pain - part of body:  (back/ribs) ?  ? ? ?Time: 1202-1225 ?PT Time Calculation (min) (ACUTE ONLY): 23 min ? ?Charges:  $Gait Training: 8-22 mins          ?          ? ?Zachary Reeves, PT ?Acute Rehabilitation Services ?PQZRA:[phone] ?Office:[phone] ?03/03/2022 ? ? ? ?Zachary Reeves ?03/03/2022, 3:25 PM ? ?

## 2022-03-04 ENCOUNTER — Inpatient Hospital Stay (HOSPITAL_COMMUNITY): Payer: Medicaid Other

## 2022-03-04 LAB — CBC
HCT: 36.6 % — ABNORMAL LOW (ref 39.0–52.0)
Hemoglobin: 12.6 g/dL — ABNORMAL LOW (ref 13.0–17.0)
MCH: 30.6 pg (ref 26.0–34.0)
MCHC: 34.4 g/dL (ref 30.0–36.0)
MCV: 88.8 fL (ref 80.0–100.0)
Platelets: 94 10*3/uL — ABNORMAL LOW (ref 150–400)
RBC: 4.12 MIL/uL — ABNORMAL LOW (ref 4.22–5.81)
RDW: 14 % (ref 11.5–15.5)
WBC: 5 10*3/uL (ref 4.0–10.5)
nRBC: 0 % (ref 0.0–0.2)

## 2022-03-04 LAB — BASIC METABOLIC PANEL
Anion gap: 7 (ref 5–15)
BUN: 12 mg/dL (ref 6–20)
CO2: 26 mmol/L (ref 22–32)
Calcium: 8.4 mg/dL — ABNORMAL LOW (ref 8.9–10.3)
Chloride: 104 mmol/L (ref 98–111)
Creatinine, Ser: 0.68 mg/dL (ref 0.61–1.24)
GFR, Estimated: >60 mL/min (ref >60)
Glucose, Bld: 112 mg/dL — ABNORMAL HIGH (ref 70–99)
Potassium: 4.3 mmol/L (ref 3.5–5.1)
Sodium: 137 mmol/L (ref 135–145)

## 2022-03-04 LAB — MAGNESIUM: Magnesium: 1.9 mg/dL (ref 1.7–2.4)

## 2022-03-04 NOTE — Evaluation (Signed)
Occupational Therapy Evaluation ?Patient Details ?Name: Zachary Reeves ?MRN: [id number] ?DOB: 02-Mar-1966 ?Today's Date: 03/04/2022 ? ? ?History of Present Illness Numa Viona Gilmore Counsell is a 56 y.o. male who presented as a non-level trauma after a lawn mower flipped ontop of him with multiple bilateral rib fx and moderate R PTX.  Also found nondisplaced C7 anterior superior endplate osteophyte fx.   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 ?Clinical Impression ?  ?PTA, pt lives with spouse, typically Independent with ADLs (aside from assist for socks/shoes), community IADLs and mobility. Pt presents now with primary deficit being pain from rib fx. Overall, pt requires Min A for UB/LB ADLs, min guard for long distance mobility. Pt with slow, cautious gait and prefers to hold to IV pole for support. Despite pain, pt eager to participate and be able to go home soon. Anticipate once rib fx pain controlled, pt will quickly return to PLOF without need for OT follow-up. Pt's wife present, endorses ability to assist pt at discharge. Will follow acutely to progress independence within pain tolerance. ?   ? ?Recommendations for follow up therapy are one component of a multi-disciplinary discharge planning process, led by the attending physician.  Recommendations may be updated based on patient status, additional functional criteria and insurance authorization.  ? ?Follow Up Recommendations ? No OT follow up  ?  ?Assistance Recommended at Discharge Set up Supervision/Assistance  ?Patient can return home with the following A little help with bathing/dressing/bathroom;Assistance with cooking/housework;Assist for transportation ? ?  ?Functional Status Assessment ? Patient  has had a recent decline in their functional status and demonstrates the ability to make significant improvements in function in a reasonable and predictable amount of time.  ?Equipment Recommendations ? None recommended by OT  ?  ?Recommendations for Other Services   ? ? ?  ?Precautions / Restrictions Precautions ?Precautions: Fall;Other (comment) ?Precaution Comments: cervical brace not needed per MD ?Restrictions ?Weight Bearing Restrictions: No  ? ?  ? ?Mobility Bed Mobility ?Overal bed mobility: Modified Independent ?  ?  ?  ?  ?  ?  ?  ?  ? ?Transfers ?Overall transfer level: Needs assistance ?  ?Transfers: Sit to/from Stand ?Sit to Stand: Supervision ?  ?  ?  ?  ?  ?General transfer comment: requesting IV pole for support ?  ? ?  ?Balance Overall balance assessment: Needs assistance ?  ?Sitting balance-Leahy Scale: Good ?  ?  ?Standing balance support: Single extremity supported ?Standing balance-Leahy Scale: Fair ?Standing balance comment: limited by pain, kept hand on the IV pole throughout ?  ?  ?  ?  ?  ?  ?  ?  ?  ?  ?  ?   ? ?ADL either performed or assessed with clinical judgement  ? ?ADL Overall ADL's : Needs assistance/impaired ?Eating/Feeding: Independent;Sitting ?  ?Grooming: Supervision/safety;Standing ?  ?Upper Body Bathing: Minimal assistance;Sitting ?  ?Lower Body Bathing: Minimal assistance;Sit to/from stand ?  ?Upper Body Dressing : Set up;Standing ?Upper Body Dressing Details (indicate cue type and reason): to don gown around back ?Lower Body Dressing: Minimal assistance;Sit to/from stand ?  ?Toilet Transfer: Min guard;Ambulation ?Toilet Transfer Details (indicate cue type and reason): says he has been going to bathroom at night without assist ?Toileting-  Clothing Manipulation and Hygiene: Supervision/safety;Sitting/lateral lean;Sit to/from stand ?  ?  ?  ?Functional mobility during ADLs: Min guard ?General ADL Comments: slow, cautious gait due to rib fx pain. educated on abdominal splinting  for pain mgmt with coughing, compensatory strategies for LB ADLs; where assist may be needed. Encouraged pt moblity with significant other present to assist  ? ? ? ?Vision Ability to See in Adequate Light: 0 Adequate ?Patient Visual Report: No change from baseline ?Vision Assessment?: No apparent visual deficits  ?   ?Perception   ?  ?Praxis   ?  ? ?Pertinent Vitals/Pain Pain Assessment ?Pain Assessment: Faces ?Faces Pain Scale: Hurts little more ?Pain Location: ribs when coughing/laughing ?Pain Descriptors / Indicators: Grimacing, Guarding ?Pain Intervention(s): Monitored during session, Premedicated before session  ? ? ? ?Hand Dominance Right ?  ?Extremity/Trunk Assessment Upper Extremity Assessment ?Upper Extremity Assessment: Overall WFL for tasks assessed (mild limitations due to rib fx soreness) ?  ?Lower Extremity Assessment ?Lower Extremity Assessment: Defer to PT evaluation ?  ?Cervical / Trunk Assessment ?Cervical / Trunk Assessment: Other exceptions ?Cervical / Trunk Exceptions: stiff in neck with h/o cervical fusion and radiation necrosis of occiput; stiff through ribs R >L with pain ?  ?Communication Communication ?Communication: Expressive difficulties;Other (comment) (due to h/o oropharyngeal CA and reconstruction) ?  ?Cognition Arousal/Alertness: Awake/alert ?Behavior During Therapy: [facility] for tasks assessed/performed ?Overall Cognitive Status: Within Functional Limits for tasks assessed ?  ?  ?  ?  ?  ?  ?  ?  ?  ?  ?  ?  ?  ?  ?  ?  ?  ?  ?  ?General Comments  HR WFL, spouse present and supportive ? ?  ?Exercises   ?  ?Shoulder Instructions    ? ? ?Home Living Family/patient expects to be discharged to:: Private residence ?Living Arrangements: Spouse/significant other ?Available Help at Discharge: Family ?Type of Home: House ?Home Access: Stairs to enter ?Entrance Stairs-Number of Steps: 2 ?Entrance Stairs-Rails: Right ?Home Layout: One level ?  ?  ?Bathroom Shower/Tub: Walk-in shower ?  ?Bathroom  Toilet: Standard ?  ?  ?Home Equipment: Conservation officer, nature (2 wheels);Shower seat ?  ?Additional Comments: Pt's spouse  has access to RWs and shower chair ?  ? ?  ?Prior Functioning/Environment Prior Level of Function : Independent/Modified Independent;Needs assist ?  ?  ?  ?Physical Assist : ADLs (physical) ?  ?ADLs (physical): Dressing;IADLs ?Mobility Comments: no use of AD for mobility ?ADLs Comments: Pt's spouse assists with socks/shoes due to difficulty reaching, Independent for all other ADLs. Wife does household IADLs, pt does yard work ?  ? ?  ?  ?OT Problem List: Decreased activity tolerance;Impaired balance (sitting and/or standing);Decreased strength;Pain ?  ?   ?OT Treatment/Interventions: Self-care/ADL training;Therapeutic exercise;Energy conservation;DME and/or AE instruction;Therapeutic activities;Patient/family education;Balance training  ?  ?OT Goals(Current goals can be found in the care plan section) Acute Rehab OT Goals ?Patient Stated Goal: go home tomorrow ?OT Goal Formulation: With patient ?Time For Goal Achievement: 03/18/22 ?Potential to Achieve Goals: Good  ?OT Frequency: Min 2X/week ?  ? ?Co-evaluation   ?  ?  ?  ?  ? ?  ?AM-PAC OT "6 Clicks" Daily Activity     ?Outcome Measure Help from another person eating meals?: None ?Help from another person taking care of personal grooming?: A Little ?Help from another person toileting, which includes using toliet, bedpan, or urinal?: A Little ?Help from another person bathing (including washing, rinsing, drying)?: A Little ?  Help from another person to put on and taking off regular upper body clothing?: A Little ?Help from another person to put on and taking off regular lower body clothing?: A Little ?6 Click Score: 19 ?  ?End of Session Equipment Utilized During Treatment: Gait belt ?Nurse Communication: Mobility status ? ?Activity Tolerance: Patient tolerated treatment well ?Patient left: in bed;with call bell/phone within reach;with family/visitor  present ? ?OT Visit Diagnosis: Other abnormalities of gait and mobility (R26.89);Pain ?Pain - part of body:  (rib)  ?              ?Time: 4270-6237 ?OT Time Calculation (min): 28 min ?Charges:  OT General

## 2022-03-04 NOTE — Progress Notes (Signed)
? ? ?   ?Subjective: ?CC: ?Reports yesterday was rough, but pain is little better controlled this morning. ? ?Objective: ?Vital signs in last 24 hours: ?Temp:  [97.6 ?F (36.4 ?C)-98.2 ?F (36.8 ?C)] 98 ?F (36.7 ?C) (05/14 0725) ?Pulse Rate:  [65-70] 65 (05/14 0725) ?Resp:  [13-19] 13 (05/14 0725) ?BP: (114-126)/(75-92) 117/92 (05/14 0725) ?SpO2:  [90 %-93 %] 93 % (05/14 0725) ?Last BM Date :  (pta) ? ?Intake/Output from previous day: ?05/13 0701 - 05/14 0700 ?In: 100 [I.V.:100] ?Out: -  ?Intake/Output this shift: ?No intake/output data recorded. ? ?PE: ?Gen:  Alert, NAD, pleasant ?Neck: No c-spine ttp ?Card:  RRR ?Pulm:  CTAB, no W/R/R, effort normal ?Abd: Soft, ND, NT, +BS, g-tube in place ?Neuro. Good strength to BUE and BLE's and equal. SILT to BUE and BLE's.  ?Ext:  MAE's without difficulty. No LE edema ?Psych: A&Ox3  ? ?Lab Results:  ?Recent Labs  ?  03/02/22 ?5397 03/04/22 ?0236  ?WBC 6.0 5.0  ?HGB 13.9 12.6*  ?HCT 40.0 36.6*  ?PLT 123* 94*  ? ? ?BMET ?Recent Labs  ?  03/02/22 ?0457 03/04/22 ?0236  ?NA 135 137  ?K 3.8 4.3  ?CL 104 104  ?CO2 24 26  ?GLUCOSE 97 112*  ?BUN 17 12  ?CREATININE 0.65 0.68  ?CALCIUM 8.5* 8.4*  ? ? ?PT/INR ?Recent Labs  ?  03/01/22 ?1454  ?LABPROT 13.1  ?INR 1.0  ? ? ?CMP  ?   ?Component Value Date/Time  ? NA 137 03/04/2022 0236  ? K 4.3 03/04/2022 0236  ? CL 104 03/04/2022 0236  ? CO2 26 03/04/2022 0236  ? GLUCOSE 112 (H) 03/04/2022 0236  ? BUN 12 03/04/2022 0236  ? CREATININE 0.68 03/04/2022 0236  ? CALCIUM 8.4 (L) 03/04/2022 0236  ? PROT 6.2 (L) 03/02/2022 0457  ? ALBUMIN 3.2 (L) 03/02/2022 0457  ? AST 42 (H) 03/02/2022 0457  ? ALT 31 03/02/2022 0457  ? ALKPHOS 75 03/02/2022 0457  ? BILITOT 0.9 03/02/2022 0457  ? GFRNONAA >60 03/04/2022 0236  ? GFRAA >60 03/02/2019 0827  ? ?Lipase  ?   ?Component Value Date/Time  ? LIPASE 12 09/08/2009 0736  ? ? ?Studies/Results: ?DG Chest Port 1 View ? ?Result Date: 03/04/2022 ?CLINICAL DATA:  Rib fractures EXAM: PORTABLE CHEST 1 VIEW COMPARISON:   03/03/2022 FINDINGS: Small, persistent right apical pneumothorax, approximately 10% volume. Probable small layering right pleural effusion. The left lung is normally aerated. Displaced fractures of the posterior and lateral upper right ribs. Heart and mediastinum are normal. IMPRESSION: 1. Small, persistent right apical pneumothorax, approximately 10% volume. 2. Probable small layering right pleural effusion. 3. Displaced fractures of the posterior and lateral upper right ribs. Electronically Signed   By: Delanna Ahmadi M.D.   On: 03/04/2022 09:22  ? ?DG CHEST PORT 1 VIEW ? ?Result Date: 03/03/2022 ?CLINICAL DATA:  Pneumothorax. EXAM: PORTABLE CHEST 1 VIEW COMPARISON:  03/02/2022 FINDINGS: Interval decrease in right pneumothorax with tiny apical component visible on today's study. Interval increase in left base atelectasis. Interstitial markings are diffusely coarsened with chronic features. Persistent fluid in the right pleural space is well. IMPRESSION: 1. Interval decrease in right pneumothorax with tiny apical component visible on today's study. 2. Interval increase in left base atelectasis. Electronically Signed   By: Misty Stanley M.D.   On: 03/03/2022 10:11  ? ?DG Cerv Spine Flex&Ext Only ? ?Result Date: 03/02/2022 ?CLINICAL DATA:  C7 fracture. EXAM: CERVICAL SPINE - FLEXION AND EXTENSION VIEWS ONLY COMPARISON:  Cervical spine CT-03/01/2022 FINDINGS: C1 to the inferior endplate of C6 is imaged on both provided flexion and extension radiographs. There is obscuration of the C6-C7 articulation as well as the cervicothoracic junction secondary overlying osseous and soft tissue structures. Known nondisplaced fracture involving the osteophyte arising from the superior endplate of the C7 vertebral body is not visualized on the present examination. Post C1-C3 paraspinal fusion, incompletely evaluated but without evidence of hardware failure or loosening. There is straightening and reversal of the expected cervical  lordosis with mild kyphosis centered about the C3-C4 articulation. No anterolisthesis or retrolisthesis. No anterolisthesis or retrolisthesis is elicited given the limited degrees of acquired flexion and extension. Cervical vertebral body heights appear preserved. Prevertebral soft tissues appear normal Moderate to severe DDD throughout the cervical spine, worse at C3-C4 and C5-C6 with disc space height loss, endplate irregularity and sclerosis Multiple surgical clips overlie the oro and hypopharynx. IMPRESSION: 1. Known nondisplaced fracture involving the anterior osteophyte arising from the superior endplate of C7 is not visualized on the present examination. 2. No evidence of dynamic cervical spine instability given the acquired degrees of flexion and extension. 3. Post C1-C3 paraspinal fusion, incompletely evaluated but without evidence of hardware failure or loosening. Electronically Signed   By: Sandi Mariscal M.D.   On: 03/02/2022 14:03   ? ?Anti-infectives: ?Anti-infectives (From admission, onward)  ? ? None  ? ?  ? ? ? ?Assessment/Plan ?Lawnmower accident  ?Multiple bilateral rib fractures with R PTX -  R 2-7 rib fx's. L 3-5th rib fx's. CXR this AM w/ increased consolidation in RLL. Cont supplemental oxygen. Aggressive Pulm toilet. Multimodal pain control. PT. ?C7 Fx - noted Rads addendum at 842am 5/12 there is a nondisplaced fracture through the anterior osteophyte at the superior endplate of C7 where it meets the vertebral body. Flex ex xompleted and C collar removed per nsgy. ?Hx of SCC of nasal cavity s/p surgery/reconstruction, chemo and radiation - Reported to be in remission. Follows with ENT (Dr. Patwa/Dr. Nicolette Bang) and Oncology (Dr. 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  ?- My colleague discussed with pharmacy on admission. 30 mg oxy IR q6h should be equivalent to 36 mg Xtampza TID. Continued home 15 mg oxycodone IR q4h prn. Cont Baclofen (increased from home dose), lidocaine patches, and IV toradol/IV dilaudid for pain control  ?  ?FEN - TF per patient's home regimen, stop IVF ?VTE - LMWH ?ID - tdap updated at recent PCP visit ?Foley - none  ?  ?Dispo - Pain control. Pulm toilet. PT. he received 6 doses of IV Dilaudid yesterday.  We discussed trying to taper off of that, if pain is adequately controlled without IV medication anticipate he can be discharged tomorrow ? ? LOS: 3 days  ? ? ?Clovis Riley ,MD ?[facility] ?03/04/2022, 9:46 AM ?Please see Amion for pager number during day hours 7:00am-4:30pm ? ?

## 2022-03-05 ENCOUNTER — Other Ambulatory Visit (HOSPITAL_COMMUNITY): Payer: Self-pay

## 2022-03-05 MED ORDER — ACETAMINOPHEN 500 MG PO TABS: 1000.0000 mg | ORAL_TABLET | Freq: Four times a day (QID) | ORAL | 0 refills | Status: AC | PRN

## 2022-03-05 MED ORDER — GUAIFENESIN 100 MG/5ML PO LIQD
10.0000 mL | Freq: Four times a day (QID) | ORAL | Status: DC
  Administered 2022-03-05 (×2): 10 mL
  Filled 2022-03-05 (×2): qty 10

## 2022-03-05 MED ORDER — BACLOFEN 10 MG PO TABS
20.0000 mg | ORAL_TABLET | Freq: Three times a day (TID) | ORAL | 0 refills | Status: AC
  Filled 2022-03-05: qty 60, 10d supply, fill #0

## 2022-03-05 MED ORDER — IBUPROFEN 100 MG/5ML PO SUSP
800.0000 mg | Freq: Three times a day (TID) | ORAL | 0 refills | Status: AC | PRN
  Filled 2022-03-05: qty 240, 2d supply, fill #0

## 2022-03-05 MED ORDER — GUAIFENESIN 100 MG/5ML PO LIQD
10.0000 mL | Freq: Four times a day (QID) | ORAL | 0 refills | Status: AC | PRN
  Filled 2022-03-05: qty 118, 3d supply, fill #0

## 2022-03-05 MED ORDER — OXYCODONE HCL 15 MG PO TABS
15.0000 mg | ORAL_TABLET | ORAL | 0 refills | Status: AC | PRN
  Filled 2022-03-05: qty 10, 2d supply, fill #0

## 2022-03-05 MED ORDER — LIDOCAINE 5 % EX PTCH
2.0000 | MEDICATED_PATCH | CUTANEOUS | 0 refills | Status: AC
Start: 2022-03-05 — End: ?
  Filled 2022-03-05: qty 60, 30d supply, fill #0

## 2022-03-05 MED ORDER — PANTOPRAZOLE SODIUM 40 MG PO PACK
40.0000 mg | PACK | Freq: Every day | ORAL | 0 refills | Status: AC
  Filled 2022-03-05: qty 30, 30d supply, fill #0

## 2022-03-05 MED ORDER — GABAPENTIN 250 MG/5ML PO SOLN
300.0000 mg | Freq: Three times a day (TID) | ORAL | 0 refills | Status: AC
  Filled 2022-03-05: qty 180, 10d supply, fill #0

## 2022-03-05 NOTE — Progress Notes (Signed)
Progress Note     Subjective: Pt reports pain in bilateral ribs but has been able to work on weaning off IV pain meds. He really wants to go home today. He follows with Dr. Veleta Miners for pain management and would like someone to speak with him regarding pain management upon discharge. He is tolerating TF and voiding appropriately. He denies worsening SOB. I took him an IS this AM and he was able to pull 1750 without much difficulty. I have asked RN to bring him a flutter valve as well. He requests some robitussin to help with secretions today, he had some ordered prn but does not appear any was given.   Objective: Vital signs in last 24 hours: Temp:  [97.6 F (36.4 C)-99 F (37.2 C)] 97.6 F (36.4 C) (05/15 0802) Pulse Rate:  [61-69] 61 (05/15 0802) Resp:  [13-20] 20 (05/15 0802) BP: (109-134)/(67-92) 117/87 (05/15 0802) SpO2:  [92 %-95 %] 92 % (05/15 0802) Last BM Date :  (pta)  Intake/Output from previous day: No intake/output data recorded. Intake/Output this shift: No intake/output data recorded.  PE: Gen:  Alert, NAD, pleasant Neck: No c-spine ttp Card:  RRR Pulm:  CTAB, no W/R/R, effort normal, pulled 1750 on IS Abd: Soft, ND, NT, +BS, g-tube in place  Ext:  MAE's without difficulty. No LE edema Psych: A&Ox3    Lab Results:  Recent Labs    03/04/22 0236  WBC 5.0  HGB 12.6*  HCT 36.6*  PLT 94*   BMET Recent Labs    03/04/22 0236  NA 137  K 4.3  CL 104  CO2 26  GLUCOSE 112*  BUN 12  CREATININE 0.68  CALCIUM 8.4*   PT/INR No results for input(s): LABPROT, INR in the last 72 hours. CMP     Component Value Date/Time   NA 137 03/04/2022 0236   K 4.3 03/04/2022 0236   CL 104 03/04/2022 0236   CO2 26 03/04/2022 0236   GLUCOSE 112 (H) 03/04/2022 0236   BUN 12 03/04/2022 0236   CREATININE 0.68 03/04/2022 0236   CALCIUM 8.4 (L) 03/04/2022 0236   PROT 6.2 (L) 03/02/2022 0457   ALBUMIN 3.2 (L) 03/02/2022 0457   AST 42 (H) 03/02/2022 0457   ALT 31  03/02/2022 0457   ALKPHOS 75 03/02/2022 0457   BILITOT 0.9 03/02/2022 0457   GFRNONAA >60 03/04/2022 0236   GFRAA >60 03/02/2019 0827   Lipase     Component Value Date/Time   LIPASE 12 09/08/2009 0736       Studies/Results: DG Chest Port 1 View  Result Date: 03/04/2022 CLINICAL DATA:  Rib fractures EXAM: PORTABLE CHEST 1 VIEW COMPARISON:  03/03/2022 FINDINGS: Small, persistent right apical pneumothorax, approximately 10% volume. Probable small layering right pleural effusion. The left lung is normally aerated. Displaced fractures of the posterior and lateral upper right ribs. Heart and mediastinum are normal. IMPRESSION: 1. Small, persistent right apical pneumothorax, approximately 10% volume. 2. Probable small layering right pleural effusion. 3. Displaced fractures of the posterior and lateral upper right ribs. Electronically Signed   By: Delanna Ahmadi M.D.   On: 03/04/2022 09:22    Anti-infectives: Anti-infectives (From admission, onward)    None        Assessment/Plan Lawnmower accident  Multiple bilateral rib fractures with R PTX -  R 2-7 rib fx's. L 3-5th rib fx's. CXR this AM w/ increased consolidation in RLL. Cont supplemental oxygen. Aggressive Pulm toilet. Multimodal pain control. PT. C7 Fx - noted  Rads addendum at 8:42 am 5/12 there is a nondisplaced fracture through the anterior osteophyte at the superior endplate of C7 where it meets the vertebral body. Flex ex xompleted and C collar removed per nsgy. Hx of SCC of nasal cavity s/p surgery/reconstruction, chemo and radiation - Reported to be in remission. Follows with ENT (Dr. Patwa/Dr. Nicolette Bang) and Oncology (Dr. 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  - I discussed with pharmacy on admission. 30 mg oxy IR q6h should be equivalent to 36 mg Xtampza TID. Continued home 15 mg oxycodone IR q4h prn. Cont Baclofen (increased from home dose), lidocaine patches, tylenol, scheduled gabapentin and IV toradol/IV dilaudid for pain control    FEN - TF per patient's home regimen, SLIV VTE - LMWH ID - tdap updated at recent PCP visit Foley - none    Dispo - Pain control. Pulm toilet. PT. He has taken only one dose of IV pain meds since yesterday AM - really wants to try to go home today. I will reach out to his pain management provider to discuss recommendations and hopefully plan discharge this afternoon.   LOS: 4 days     Norm Parcel, [facility] 03/05/2022, 10:27 AM Please see Amion for pager number during day hours 7:00am-4:30pm

## 2022-03-05 NOTE — Progress Notes (Signed)
Physical Therapy Treatment ?Patient Details ?Name: Zachary Reeves ?MRN: [id number] ?DOB: 22-Feb-1966 ?Today's Date: 03/05/2022 ? ? ?History of Present Illness Zachary Reeves is a 56 y.o. male who presented as a non-level trauma after a lawn mower flipped ontop of him with multiple bilateral rib fx and moderate R PTX.  Also found nondisplaced C7 anterior superior endplate osteophyte fx.   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 ?  ?PT Comments  ? ? Noted plan for home today and wife/pt verbalize understanding of assistance for safety.  Patient, however, was dressed and up walking in the room when PT entered.  Noted plans for sleeping in recliner and will have assist for stairs.  Continue to feel pt will progress without PT follow up.    ?Recommendations for follow up therapy are one component of a multi-disciplinary discharge planning process, led by the attending physician.  Recommendations may be updated based on patient status, additional functional criteria and insurance authorization. ? ?Follow Up Recommendations ? No PT follow up ?  ?  ?Assistance Recommended at Discharge Intermittent Supervision/Assistance  ?Patient can return home with the following A little help with bathing/dressing/bathroom;Assistance with cooking/housework;Assist for transportation ?  ?Equipment Recommendations ? None recommended by PT  ?  ?Recommendations for Other Services   ? ? ?  ?Precautions / Restrictions Precautions ?Precautions: Fall ?Precaution Comments: cervical brace not needed per MD  ?  ? ?Mobility ? Bed Mobility ?Overal bed mobility: Modified Independent ?  ?  ?  ?  ?  ?  ?General bed mobility comments: up in room when PT entered ?  ? ?Transfers ?  ?   ?Transfers: Sit to/from Stand ?Sit to Stand: Modified independent (Device/Increase time) ?  ?  ?  ?  ?  ?  ?  ? ?Ambulation/Gait ?Ambulation/Gait assistance: Supervision ?Gait Distance (Feet): 125 Feet ?Assistive device: None ?Gait Pattern/deviations: Step-to pattern, Step-through pattern, Decreased stride length, Shuffle ?  ?  ?  ?General Gait Details: slow and guarded with hands on ribs ? ? ?Stairs ?  ?  ?  ?  ?  ? ? ?Wheelchair Mobility ?  ? ?Modified Rankin (Stroke Patients Only) ?  ? ? ?  ?Balance Overall balance assessment: Needs assistance ?  ?Sitting balance-Leahy Scale: Good ?  ?  ?  ?Standing balance-Leahy Scale: Fair ?  ?  ?  ?  ?  ?  ?  ?  ?  ?  ?  ?  ?  ? ?  ?Cognition Arousal/Alertness: Awake/alert ?Behavior During Therapy: [facility] for tasks assessed/performed ?Overall Cognitive Status: Within Functional Limits for tasks assessed ?  ?  ?  ?  ?  ?  ?  ?  ?  ?  ?  ?  ?  ?  ?  ?  ?  ?  ?  ? ?  ?Exercises   ? ?  ?General Comments General comments (skin integrity, edema, etc.): wife arrived when pt back to room, pt declined need to practice steps, she reports has two to enter so  discussed how to assist and discussed car transfers ?  ?  ? ?Pertinent Vitals/Pain Pain Assessment ?Pain Assessment: Faces ?Faces Pain Scale: Hurts even more ?Pain Location: ribs with mobility ?Pain Descriptors / Indicators: Grimacing, Guarding ?Pain  Intervention(s): Monitored during session, Repositioned, Premedicated before session  ? ? ?Home Living   ?  ?  ?  ?  ?  ?  ?  ?  ?  ?   ?  ?Prior Function    ?  ?  ?   ? ?PT Goals (current goals can now be found in the care plan section) Progress towards PT goals: Progressing toward goals ? ?  ?Frequency ? ? ? Min 4X/week ? ? ? ?  ?PT Plan Current plan remains appropriate  ? ? ?Co-evaluation   ?  ?  ?  ?  ? ?  ?AM-PAC PT "6 Clicks" Mobility   ?Outcome Measure ? Help needed turning from your back to your side while in a flat bed without using bedrails?: None ?Help needed moving from lying  on your back to sitting on the side of a flat bed without using bedrails?: None ?Help needed moving to and from a bed to a chair (including a wheelchair)?: None ?Help needed standing up from a chair using your arms (e.g., wheelchair or bedside chair)?: None ?Help needed to walk in hospital room?: A Little ?Help needed climbing 3-5 steps with a railing? : A Little ?6 Click Score: 22 ? ?  ?End of Session   ?Activity Tolerance: Patient tolerated treatment well ?Patient left: in chair;with call bell/phone within reach;with family/visitor present ?  ?PT Visit Diagnosis: Other abnormalities of gait and mobility (R26.89);Pain ?  ? ? ?Time: 3903-0092 ?PT Time Calculation (min) (ACUTE ONLY): 24 min ? ?Charges:  $Gait Training: 8-22 mins ?$Self Care/Home Management: 8-22          ?          ? ?Magda Kiel, PT ?Acute Rehabilitation Services ?ZRAQT:[phone] ?[phone] ?03/05/2022 ? ? ? ?[name] ?03/05/2022, 5:04 PM ? ?

## 2022-03-05 NOTE — Discharge Summary (Signed)
Physician Discharge Summary  Patient ID: Zachary Reeves MRN: [id number] DOB/AGE: 1966-01-16 56 y.o.  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 male who presented as a non-level trauma after a lawn mower flipped ontop of him. Patient complains of right rib pain and sob. No other complaints. Workup shows multiple bilateral rib fx and moderate R PTX. Trauma asked to see for admission. Remainder of his workup reassuring.    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 He follows with ENT (Dr. Patwa/Dr. Nicolette Bang) and Oncology (Dr. Melven Sartorius) as an outpatient for this . He is in a pain mgt program and takes XTampza 36 mg PO TID, Oxycodone IR 15 mg PO q4h PRN, and baclofen 10 mg PO TID PRN daily.  Apparently supposed to have surgery to reconstruct tear ducts next month for a tear duct injury after surgery that causes his eyes to watery constantly.   Hospital Course:  Multiple bilateral rib fractures with Right Pneumothorax  Patient found to have Right 2-7 rib fractures and Left 3-5 rib fractures with right pneumothorax. Managed with multimodal pain control and aggressive  pulmonary toilet. Serial chest xrays were followed and pneumothorax improved. Patient remained stable from a respiratory standpoint. He will have a chest xray 2 weeks after discharge and trauma clinic will follow up with him with results.   C7 Fracture  CT scan revealed nondisplaced fracture through the anterior osteophyte at the superior endplate of C7 where it meets the vertebral body. Neurosurgery was consulted and recommended flex ex film. This was stable and neurosurgery advised c-collar could be removed.  Patient worked with therapies during this admission who recommended no therapy follow up upon discharge. On 5/15 the patient was felt stable for discharge home.  Patient will follow up as below and knows to call with questions or concerns.     Allergies as of 03/05/2022       Reactions   Penicillins Other (See Comments)   UNSPECIFIED CHILDHOOD REACTION  Did it involve swelling of the face/tongue/throat, SOB, or low BP? Unknown Did it involve sudden or severe rash/hives, skin peeling, or any reaction on the inside of your mouth or nose? Unknown Did you need to seek medical attention at a hospital or doctor's office? Unknown When did it last happen?      childhood allergy If all above answers are "NO", may proceed with cephalosporin use.        Medication List     STOP taking these medications    BC HEADACHE POWDER PO   HYDROcodone-acetaminophen 7.5-325 MG tablet Commonly known as: Norco   promethazine 25 MG suppository Commonly known as: PHENERGAN       TAKE these medications    acetaminophen 500 MG tablet Commonly known as: TYLENOL Take 2  tablets (1,000 mg total) by mouth every 6 (six) hours as needed for mild pain or fever.   baclofen 20 MG tablet Commonly known as: LIORESAL Place 1 tablet (20 mg total) into feeding tube 3 (three) times daily. What changed:  medication strength how much to take when to take this reasons to take this   gabapentin 250 MG/5ML  solution Commonly known as: NEURONTIN Place 6 mLs (300 mg total) into feeding tube 3 (three) times daily.   guaiFENesin 100 MG/5ML liquid Commonly known as: ROBITUSSIN Place 10 mLs into feeding tube every 6 (six) hours as needed for cough or to loosen phlegm.   ibuprofen 100 MG/5ML suspension Commonly known as: ADVIL Take 40 mLs (800 mg total) by mouth every 8 (eight) hours as needed for moderate pain. Take with tube feeding so that you have something on your stomach   lidocaine 5 % Commonly known as: LIDODERM Place 2 patches onto the skin daily. Remove & Discard patch within 12 hours or as directed by MD   oxyCODONE 15 MG immediate release tablet Commonly known as: ROXICODONE Place 1 tablet (15 mg total) into feeding tube every 4 (four) hours as needed for moderate pain or severe pain. What changed:  when to take this reasons to take this   pantoprazole sodium 40 mg Commonly known as: PROTONIX Place 40 mg into feeding tube daily.   Xtampza ER 36 MG C12a Generic drug: oxyCODONE ER Place 36 mg into feeding tube every 8 (eight) hours.          Follow-up [place]. Schedule an appointment as soon as possible for a visit in 2 week(s).   Why: For follow up chest X-ray Contact information: [place] [address] [phone]         [place] Follow up.   Why: We will call you with results of follow up chest x-ray in about 2 weeks. Contact information: [address] [place] [address] [phone]        Cathren Harsh, MD Follow up.   Specialty: Gerontology Why: Dr. Darlin Drop office should be working on a follow up appointment for pain management in the next few weeks. Contact information: [place] [address] [phone]                 Signed: Norm Parcel , [facility] 03/05/2022, 4:10  PM Please see Amion for pager number during day hours 7:00am-4:30pm

## 2023-04-01 ENCOUNTER — Encounter (HOSPITAL_COMMUNITY): Payer: Self-pay | Admitting: Optometry

## 2023-04-01 ENCOUNTER — Other Ambulatory Visit: Payer: Self-pay

## 2023-04-01 NOTE — Progress Notes (Signed)
SDW call  Patient was given pre-op instructions over the phone. Patient verbalized understanding of instructions provided.     PCP - Dr. Zoe Lan  Cardiologist - denies Pulmonary: denies   PPM/ICD - denies  Chest x-ray - 03/02/2022 EKG -  ;n/a Stress Test - ECHO -  Cardiac Cath -   Sleep Study/sleep apnea/CPAP: denies  Non-diabetic   Blood Thinner Instructions: denies Aspirin Instructions: Took BC powder this morning. Instructed to hold all forms of ASA starting today   ERAS Protcol - No, NPO PRE-SURGERY Ensure or G2-    COVID TEST- n/a    Anesthesia review: No   Patient denies shortness of breath, fever, cough and chest pain over the phone call  Your procedure is scheduled on Tuesday June 11, 20224  Report to [facility] Main Entrance "A" at 1000 A.M., then check in with the Admitting office.  Call this number if you have problems the morning of surgery:  [phone]   If you have any questions prior to your surgery date call [phone]: Open Monday-Friday 8am-4pm If you experience any cold or flu symptoms such as cough, fever, chills, shortness of breath, etc. between now and your scheduled surgery, please notify us at the above number    Remember:  Do not eat or drink after midnight the night before your surgery Take these medicines the morning of surgery with A SIP OF WATER:  Gabapentin, protonix, xtampza  As needed: Tylenol or oxycodone  As of today, STOP taking any Aspirin (unless otherwise instructed by your surgeon) Aleve, Naproxen, Ibuprofen, Motrin, Advil, Goody's, BC's, all herbal medications, fish oil, and all vitamins.

## 2023-04-02 ENCOUNTER — Encounter (HOSPITAL_COMMUNITY): Payer: Self-pay | Admitting: Optometry

## 2023-04-02 ENCOUNTER — Encounter (HOSPITAL_COMMUNITY): Payer: Self-pay | Admitting: Anesthesiology

## 2023-04-02 ENCOUNTER — Ambulatory Visit (HOSPITAL_COMMUNITY)
Admission: RE | Admit: 2023-04-02 | Discharge: 2023-04-02 | Disposition: A | Discharge status: Home / Self Care | Payer: Medicaid Other | Attending: Optometry | Admitting: Optometry

## 2023-04-02 ENCOUNTER — Encounter (HOSPITAL_COMMUNITY)
Admission: RE | Disposition: A | Discharge status: Home / Self Care | Payer: Self-pay | Source: Home / Self Care | Attending: Optometry

## 2023-04-02 DIAGNOSIS — Z539 Procedure and treatment not carried out, unspecified reason: Secondary | ICD-10-CM | POA: Insufficient documentation

## 2023-04-02 DIAGNOSIS — H04203 Unspecified epiphora, bilateral lacrimal glands: Secondary | ICD-10-CM | POA: Diagnosis present

## 2023-04-02 HISTORY — DX: Chronic obstructive pulmonary disease, unspecified: J44.9

## 2023-04-02 LAB — COMPREHENSIVE METABOLIC PANEL
ALT: 26 U/L (ref 0–44)
AST: 27 U/L (ref 15–41)
Albumin: 4.1 g/dL (ref 3.5–5.0)
Alkaline Phosphatase: 76 U/L (ref 38–126)
Anion gap: 9 (ref 5–15)
BUN: 18 mg/dL (ref 6–20)
CO2: 24 mmol/L (ref 22–32)
Calcium: 9.3 mg/dL (ref 8.9–10.3)
Chloride: 106 mmol/L (ref 98–111)
Creatinine, Ser: 0.79 mg/dL (ref 0.61–1.24)
GFR, Estimated: >60 mL/min (ref >60)
Glucose, Bld: 92 mg/dL (ref 70–99)
Potassium: 4.1 mmol/L (ref 3.5–5.1)
Sodium: 139 mmol/L (ref 135–145)
Total Bilirubin: 0.7 mg/dL (ref 0.3–1.2)
Total Protein: 7.2 g/dL (ref 6.5–8.1)

## 2023-04-02 LAB — CBC
HCT: 48.2 % (ref 39.0–52.0)
Hemoglobin: 16 g/dL (ref 13.0–17.0)
MCH: 31 pg (ref 26.0–34.0)
MCHC: 33.2 g/dL (ref 30.0–36.0)
MCV: 93.4 fL (ref 80.0–100.0)
Platelets: 138 10*3/uL — ABNORMAL LOW (ref 150–400)
RBC: 5.16 MIL/uL (ref 4.22–5.81)
RDW: 12.7 % (ref 11.5–15.5)
WBC: 6.7 10*3/uL (ref 4.0–10.5)
nRBC: 0 % (ref 0.0–0.2)

## 2023-04-02 SURGERY — DACRYOCYSTORHINOSTOMY
Anesthesia: Monitor Anesthesia Care | Laterality: Bilateral

## 2023-04-02 MED ORDER — CEFAZOLIN SODIUM-DEXTROSE 2-4 GM/100ML-% IV SOLN
INTRAVENOUS | Status: AC
  Filled 2023-04-02: qty 100

## 2023-04-02 MED ORDER — CHLORHEXIDINE GLUCONATE 0.12 % MT SOLN
15.0000 mL | Freq: Once | OROMUCOSAL | Status: AC
  Administered 2023-04-02: 15 mL via OROMUCOSAL

## 2023-04-02 MED ORDER — CHLORHEXIDINE GLUCONATE 0.12 % MT SOLN
OROMUCOSAL | Status: AC
  Filled 2023-04-02: qty 15

## 2023-04-02 MED ORDER — ERYTHROMYCIN 5 MG/GM OP OINT
TOPICAL_OINTMENT | Freq: Once | OPHTHALMIC | Status: DC
  Filled 2023-04-02 (×2): qty 3.5

## 2023-04-02 MED ORDER — LACTATED RINGERS IV SOLN: INTRAVENOUS | Status: DC

## 2023-04-02 MED ORDER — OXYMETAZOLINE HCL 0.05 % NA SOLN
NASAL | Status: AC
  Filled 2023-04-02: qty 30

## 2023-04-02 MED ORDER — OXYMETAZOLINE HCL 0.05 % NA SOLN: 1.0000 | NASAL | Status: AC

## 2023-04-02 MED ORDER — ORAL CARE MOUTH RINSE: 15.0000 mL | Freq: Once | OROMUCOSAL | Status: AC

## 2023-04-02 MED ORDER — ACETAMINOPHEN 500 MG PO TABS: 1000.0000 mg | ORAL_TABLET | Freq: Once | ORAL | Status: DC

## 2023-04-02 MED ORDER — NEOMYCIN-POLYMYXIN-DEXAMETH 3.5-10000-0.1 OP SUSP
1.0000 [drp] | Freq: Once | OPHTHALMIC | Status: DC
  Filled 2023-04-02 (×2): qty 5

## 2023-04-02 NOTE — Progress Notes (Signed)
Patient's procedure cancelled by surgeon due to patient taking BC powders today.  Patient's IV removed with catheter intact.  Patient discharged home with wife Shawna Orleans.

## 2023-04-02 NOTE — H&P (Signed)
Patient seen in preoperative area. He took [facility] powders yesterday which contains aspirin. We are cancelling surgery as the risks of bleeding outweigh the benefits.

## 2023-09-06 IMAGING — DX DG CHEST 1V PORT
2 series · 2 of 2 positions shown · non-contrast
Comparison: 03/03/2022

CLINICAL DATA: Rib fractures

EXAM:
PORTABLE CHEST 1 VIEW

[chest ap (1 of 2)]
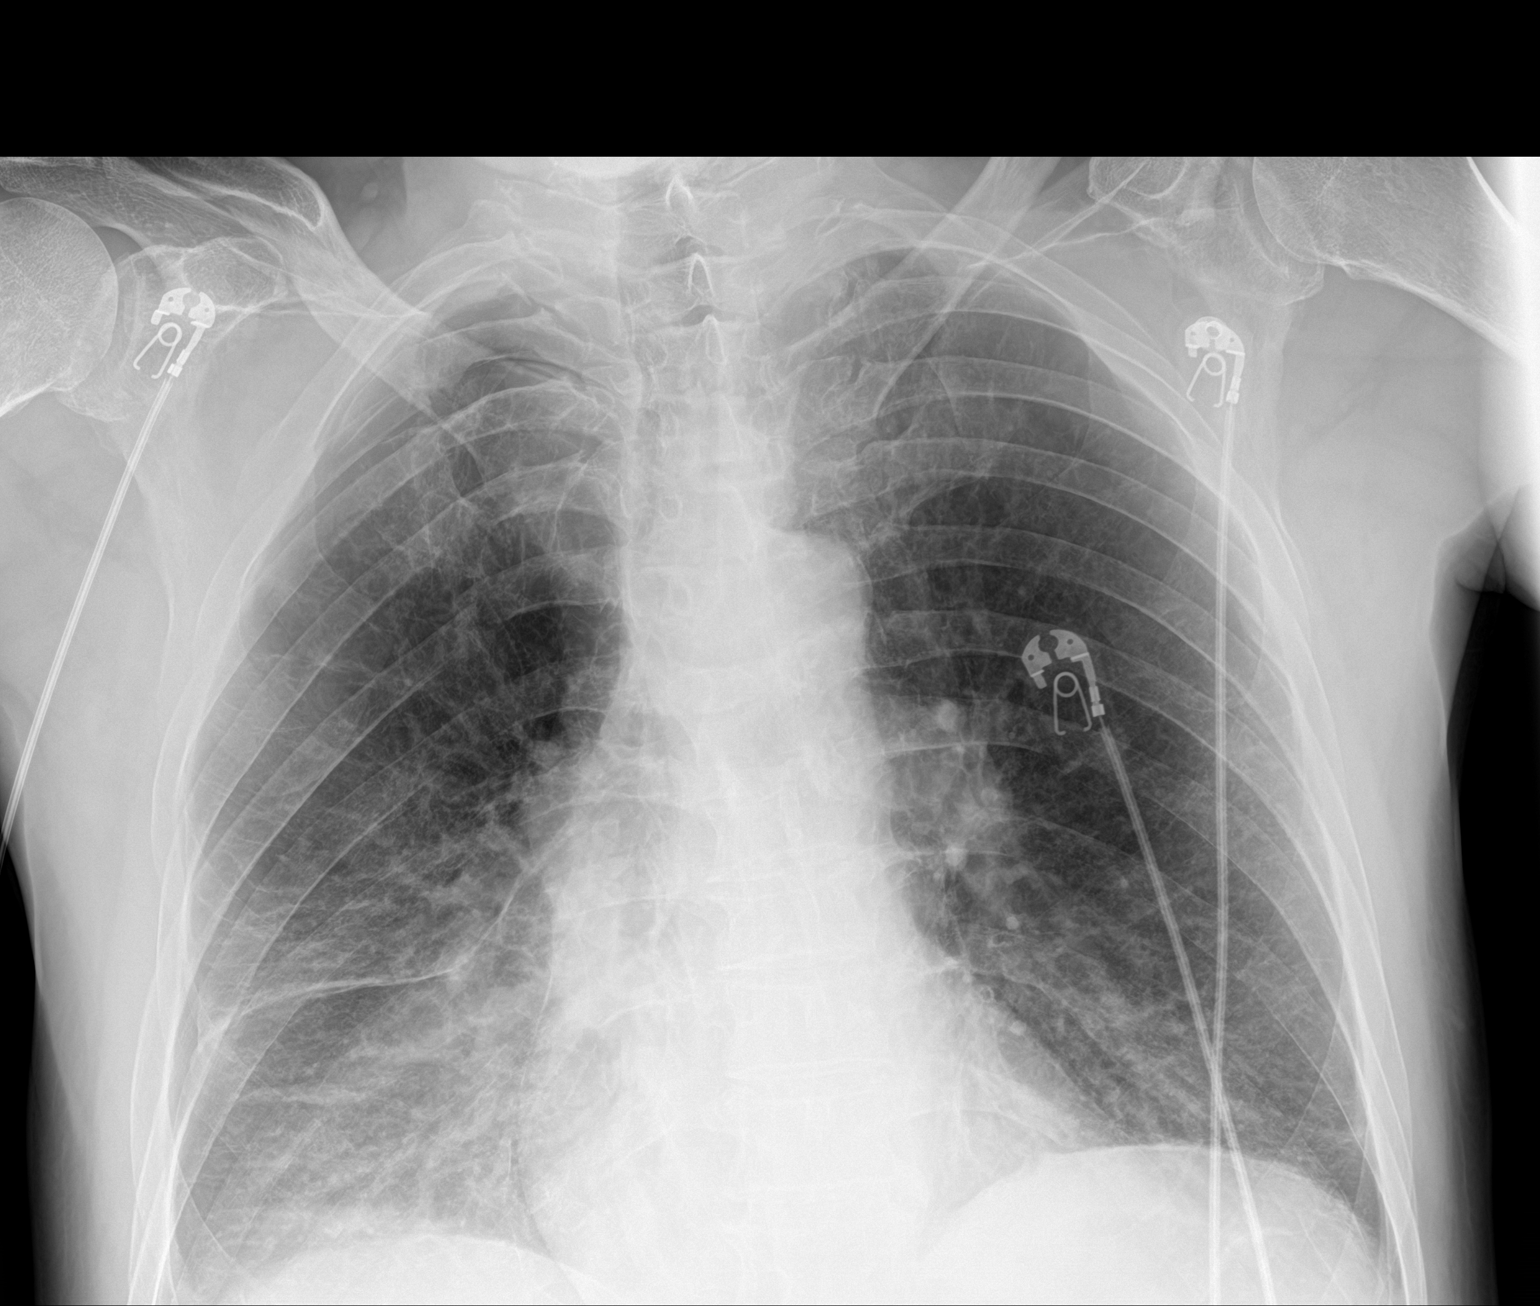

[chest ap (2 of 2)]
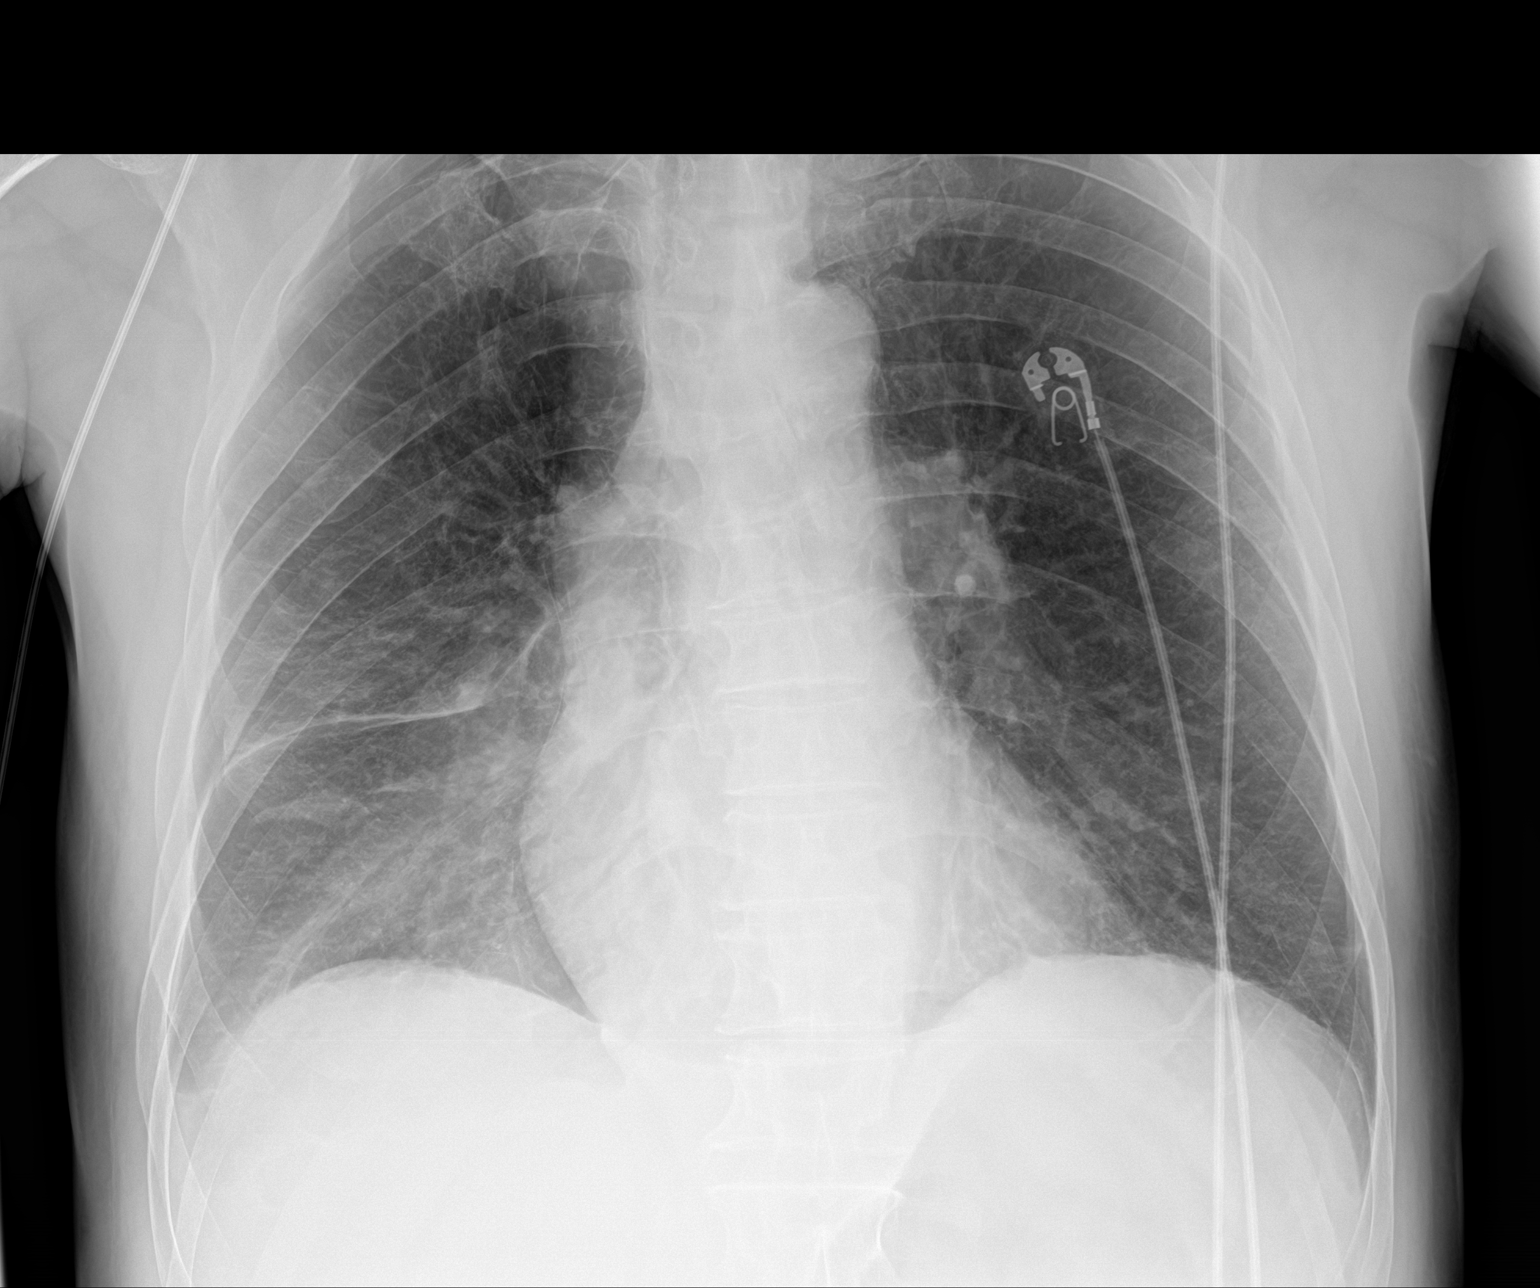

[2 of 2 positions shown; findings below may reference images not displayed]

FINDINGS: Small, persistent right apical pneumothorax, approximately 10%
volume. Probable small layering right pleural effusion. The left
lung is normally aerated. Displaced fractures of the posterior and
lateral upper right ribs. Heart and mediastinum are normal.
IMPRESSION: 1. Small, persistent right apical pneumothorax, approximately 10%
volume.
2. Probable small layering right pleural effusion.
3. Displaced fractures of the posterior and lateral upper right
ribs.

## 2024-10-19 ENCOUNTER — Other Ambulatory Visit (HOSPITAL_COMMUNITY): Payer: Self-pay | Admitting: Physical Medicine and Rehabilitation

## 2024-10-19 DIAGNOSIS — H903 Sensorineural hearing loss, bilateral: Secondary | ICD-10-CM
